# Patient Record
Sex: Male | Born: 1935 | Race: White | Hispanic: No | Marital: Married | Smoking: Never smoker
Health system: Southern US, Community
[De-identification: ages and names within clinical notes are randomized; demographics above are authoritative.]

## PROBLEM LIST (undated history)

## (undated) DIAGNOSIS — I739 Peripheral vascular disease, unspecified: Secondary | ICD-10-CM

## (undated) DIAGNOSIS — I482 Chronic atrial fibrillation, unspecified: Secondary | ICD-10-CM

## (undated) DIAGNOSIS — I499 Cardiac arrhythmia, unspecified: Secondary | ICD-10-CM

## (undated) DIAGNOSIS — N529 Male erectile dysfunction, unspecified: Secondary | ICD-10-CM

## (undated) DIAGNOSIS — I219 Acute myocardial infarction, unspecified: Secondary | ICD-10-CM

## (undated) DIAGNOSIS — E790 Hyperuricemia without signs of inflammatory arthritis and tophaceous disease: Secondary | ICD-10-CM

## (undated) DIAGNOSIS — J449 Chronic obstructive pulmonary disease, unspecified: Secondary | ICD-10-CM

## (undated) DIAGNOSIS — E785 Hyperlipidemia, unspecified: Secondary | ICD-10-CM

## (undated) DIAGNOSIS — R011 Cardiac murmur, unspecified: Secondary | ICD-10-CM

## (undated) DIAGNOSIS — I509 Heart failure, unspecified: Secondary | ICD-10-CM

## (undated) DIAGNOSIS — Z7901 Long term (current) use of anticoagulants: Secondary | ICD-10-CM

## (undated) HISTORY — DX: Acute myocardial infarction, unspecified: I21.9

## (undated) HISTORY — DX: Hyperuricemia without signs of inflammatory arthritis and tophaceous disease: E79.0

## (undated) HISTORY — DX: Heart failure, unspecified: I50.9

## (undated) HISTORY — DX: Long term (current) use of anticoagulants: Z79.01

## (undated) HISTORY — DX: Chronic atrial fibrillation, unspecified: I48.20

## (undated) HISTORY — DX: Male erectile dysfunction, unspecified: N52.9

## (undated) HISTORY — DX: Chronic obstructive pulmonary disease, unspecified: J44.9

## (undated) HISTORY — DX: Hyperlipidemia, unspecified: E78.5

## (undated) HISTORY — DX: Cardiac murmur, unspecified: R01.1

## (undated) HISTORY — DX: Peripheral vascular disease, unspecified: I73.9

## (undated) HISTORY — DX: Cardiac arrhythmia, unspecified: I49.9

---

## 2000-04-07 ENCOUNTER — Encounter: Payer: Self-pay | Admitting: Pulmonary Disease

## 2004-03-27 ENCOUNTER — Ambulatory Visit (HOSPITAL_COMMUNITY): Admission: RE | Admit: 2004-03-27 | Discharge: 2004-03-27 | Payer: Self-pay | Admitting: Gastroenterology

## 2004-03-27 ENCOUNTER — Encounter (INDEPENDENT_AMBULATORY_CARE_PROVIDER_SITE_OTHER): Payer: Self-pay | Admitting: Specialist

## 2006-03-04 ENCOUNTER — Ambulatory Visit: Admission: RE | Admit: 2006-03-04 | Discharge: 2006-03-04 | Payer: Self-pay | Admitting: Podiatry

## 2006-03-04 ENCOUNTER — Encounter: Payer: Self-pay | Admitting: Vascular Surgery

## 2006-03-17 ENCOUNTER — Ambulatory Visit (HOSPITAL_COMMUNITY): Admission: RE | Admit: 2006-03-17 | Discharge: 2006-03-17 | Payer: Self-pay | Admitting: Vascular Surgery

## 2006-05-20 ENCOUNTER — Encounter (INDEPENDENT_AMBULATORY_CARE_PROVIDER_SITE_OTHER): Payer: Self-pay | Admitting: *Deleted

## 2006-05-20 ENCOUNTER — Ambulatory Visit (HOSPITAL_COMMUNITY): Admission: RE | Admit: 2006-05-20 | Discharge: 2006-05-20 | Payer: Self-pay | Admitting: Vascular Surgery

## 2007-11-24 ENCOUNTER — Ambulatory Visit: Payer: Self-pay | Admitting: Vascular Surgery

## 2007-11-30 ENCOUNTER — Ambulatory Visit: Payer: Self-pay | Admitting: Vascular Surgery

## 2007-11-30 ENCOUNTER — Ambulatory Visit (HOSPITAL_COMMUNITY): Admission: RE | Admit: 2007-11-30 | Discharge: 2007-11-30 | Payer: Self-pay | Admitting: Vascular Surgery

## 2007-12-08 ENCOUNTER — Ambulatory Visit: Payer: Self-pay | Admitting: Vascular Surgery

## 2007-12-22 ENCOUNTER — Ambulatory Visit: Payer: Self-pay | Admitting: Vascular Surgery

## 2007-12-30 ENCOUNTER — Ambulatory Visit: Payer: Self-pay | Admitting: Vascular Surgery

## 2007-12-30 ENCOUNTER — Inpatient Hospital Stay (HOSPITAL_COMMUNITY): Admission: RE | Admit: 2007-12-30 | Discharge: 2008-01-02 | Payer: Self-pay | Admitting: Vascular Surgery

## 2007-12-30 ENCOUNTER — Encounter: Payer: Self-pay | Admitting: Vascular Surgery

## 2008-01-12 ENCOUNTER — Ambulatory Visit: Payer: Self-pay | Admitting: Vascular Surgery

## 2008-02-02 ENCOUNTER — Ambulatory Visit: Payer: Self-pay | Admitting: Vascular Surgery

## 2008-02-11 ENCOUNTER — Encounter: Payer: Self-pay | Admitting: Pulmonary Disease

## 2008-02-18 ENCOUNTER — Encounter: Payer: Self-pay | Admitting: Pulmonary Disease

## 2008-02-23 ENCOUNTER — Ambulatory Visit: Payer: Self-pay | Admitting: Vascular Surgery

## 2008-03-01 ENCOUNTER — Encounter: Payer: Self-pay | Admitting: Pulmonary Disease

## 2008-03-08 ENCOUNTER — Ambulatory Visit: Payer: Self-pay | Admitting: Vascular Surgery

## 2008-03-31 ENCOUNTER — Encounter: Payer: Self-pay | Admitting: Pulmonary Disease

## 2008-04-19 ENCOUNTER — Ambulatory Visit: Payer: Self-pay | Admitting: Vascular Surgery

## 2008-05-04 ENCOUNTER — Encounter: Payer: Self-pay | Admitting: Pulmonary Disease

## 2008-06-01 ENCOUNTER — Encounter: Payer: Self-pay | Admitting: Pulmonary Disease

## 2008-06-10 ENCOUNTER — Ambulatory Visit: Payer: Self-pay | Admitting: Pulmonary Disease

## 2008-06-10 DIAGNOSIS — E119 Type 2 diabetes mellitus without complications: Secondary | ICD-10-CM

## 2008-06-10 DIAGNOSIS — R0602 Shortness of breath: Secondary | ICD-10-CM | POA: Insufficient documentation

## 2008-06-10 DIAGNOSIS — I10 Essential (primary) hypertension: Secondary | ICD-10-CM | POA: Insufficient documentation

## 2008-06-10 DIAGNOSIS — I219 Acute myocardial infarction, unspecified: Secondary | ICD-10-CM | POA: Insufficient documentation

## 2008-06-10 DIAGNOSIS — I4891 Unspecified atrial fibrillation: Secondary | ICD-10-CM | POA: Insufficient documentation

## 2008-06-10 DIAGNOSIS — E785 Hyperlipidemia, unspecified: Secondary | ICD-10-CM | POA: Insufficient documentation

## 2008-06-10 DIAGNOSIS — I739 Peripheral vascular disease, unspecified: Secondary | ICD-10-CM

## 2008-06-10 HISTORY — DX: Peripheral vascular disease, unspecified: I73.9

## 2008-06-10 HISTORY — DX: Acute myocardial infarction, unspecified: I21.9

## 2008-07-19 ENCOUNTER — Ambulatory Visit: Payer: Self-pay | Admitting: Vascular Surgery

## 2008-09-11 ENCOUNTER — Inpatient Hospital Stay (HOSPITAL_COMMUNITY): Admission: EM | Admit: 2008-09-11 | Discharge: 2008-09-16 | Payer: Self-pay | Admitting: Emergency Medicine

## 2008-09-11 ENCOUNTER — Ambulatory Visit: Payer: Self-pay | Admitting: Surgery

## 2008-09-14 ENCOUNTER — Encounter: Payer: Self-pay | Admitting: Vascular Surgery

## 2008-09-15 ENCOUNTER — Ambulatory Visit: Payer: Self-pay | Admitting: Physical Medicine & Rehabilitation

## 2008-10-04 ENCOUNTER — Ambulatory Visit: Payer: Self-pay | Admitting: Vascular Surgery

## 2008-11-01 ENCOUNTER — Ambulatory Visit: Payer: Self-pay | Admitting: Vascular Surgery

## 2008-11-22 ENCOUNTER — Ambulatory Visit: Payer: Self-pay | Admitting: Vascular Surgery

## 2008-12-13 ENCOUNTER — Ambulatory Visit: Payer: Self-pay | Admitting: Vascular Surgery

## 2009-03-28 ENCOUNTER — Ambulatory Visit: Payer: Self-pay | Admitting: Vascular Surgery

## 2009-07-13 ENCOUNTER — Ambulatory Visit: Payer: Self-pay | Admitting: Vascular Surgery

## 2009-12-18 IMAGING — CR DG FOOT COMPLETE 3+V*L*
3 series · 3 of 3 positions shown · non-contrast
Comparison: None

CLINICAL DATA: Left foot infection for 3 months.  Diabetes.  Gout.

LEFT FOOT - COMPLETE 3+ VIEW

[view not recorded (1 of 3)]
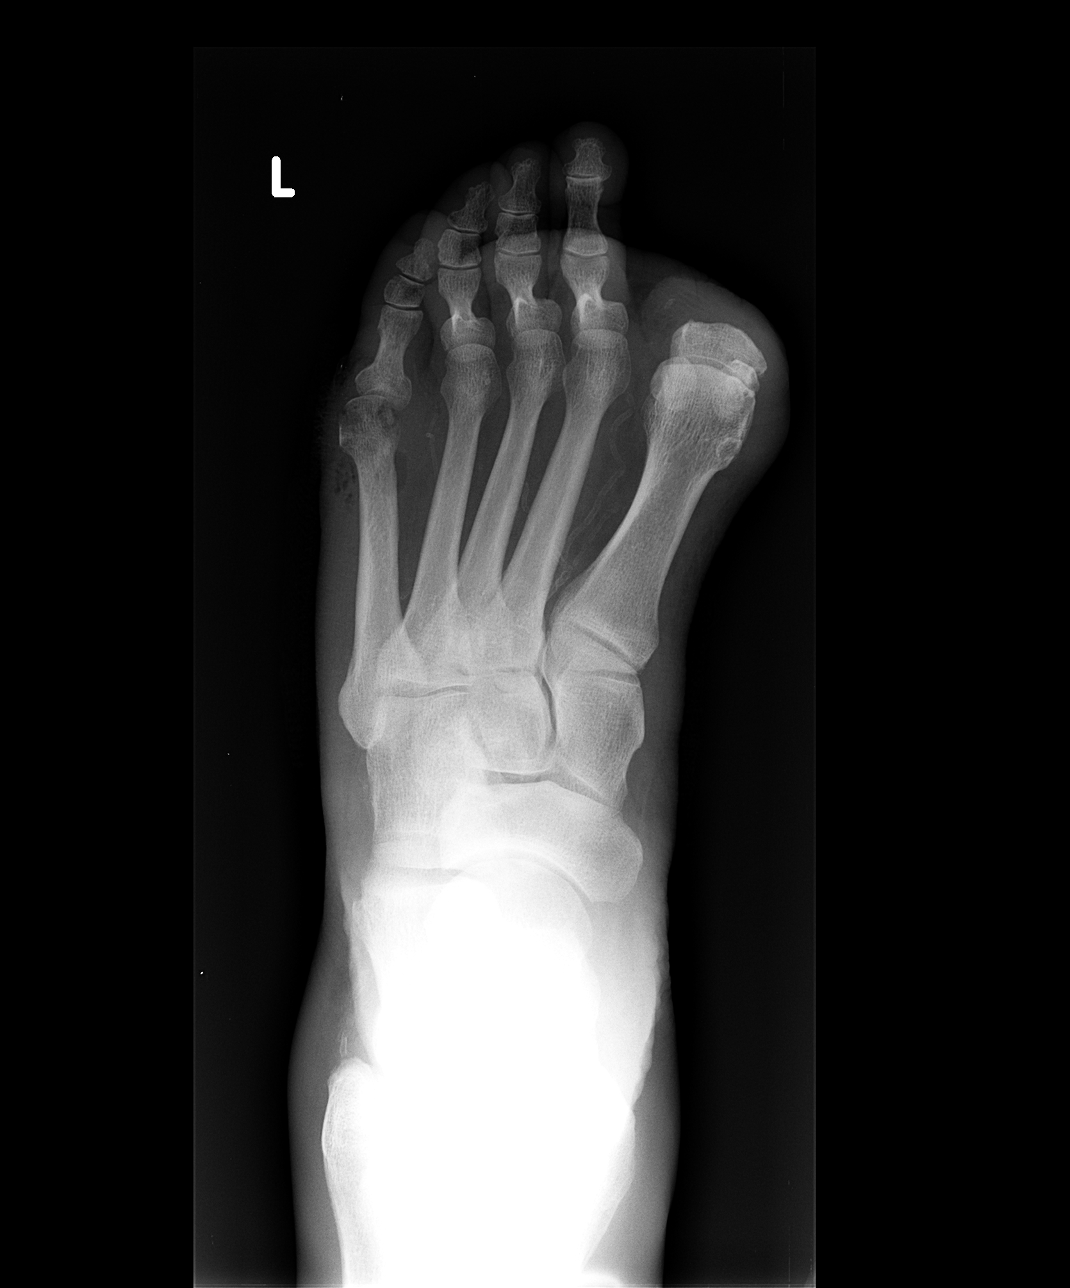

[view not recorded (2 of 3)]
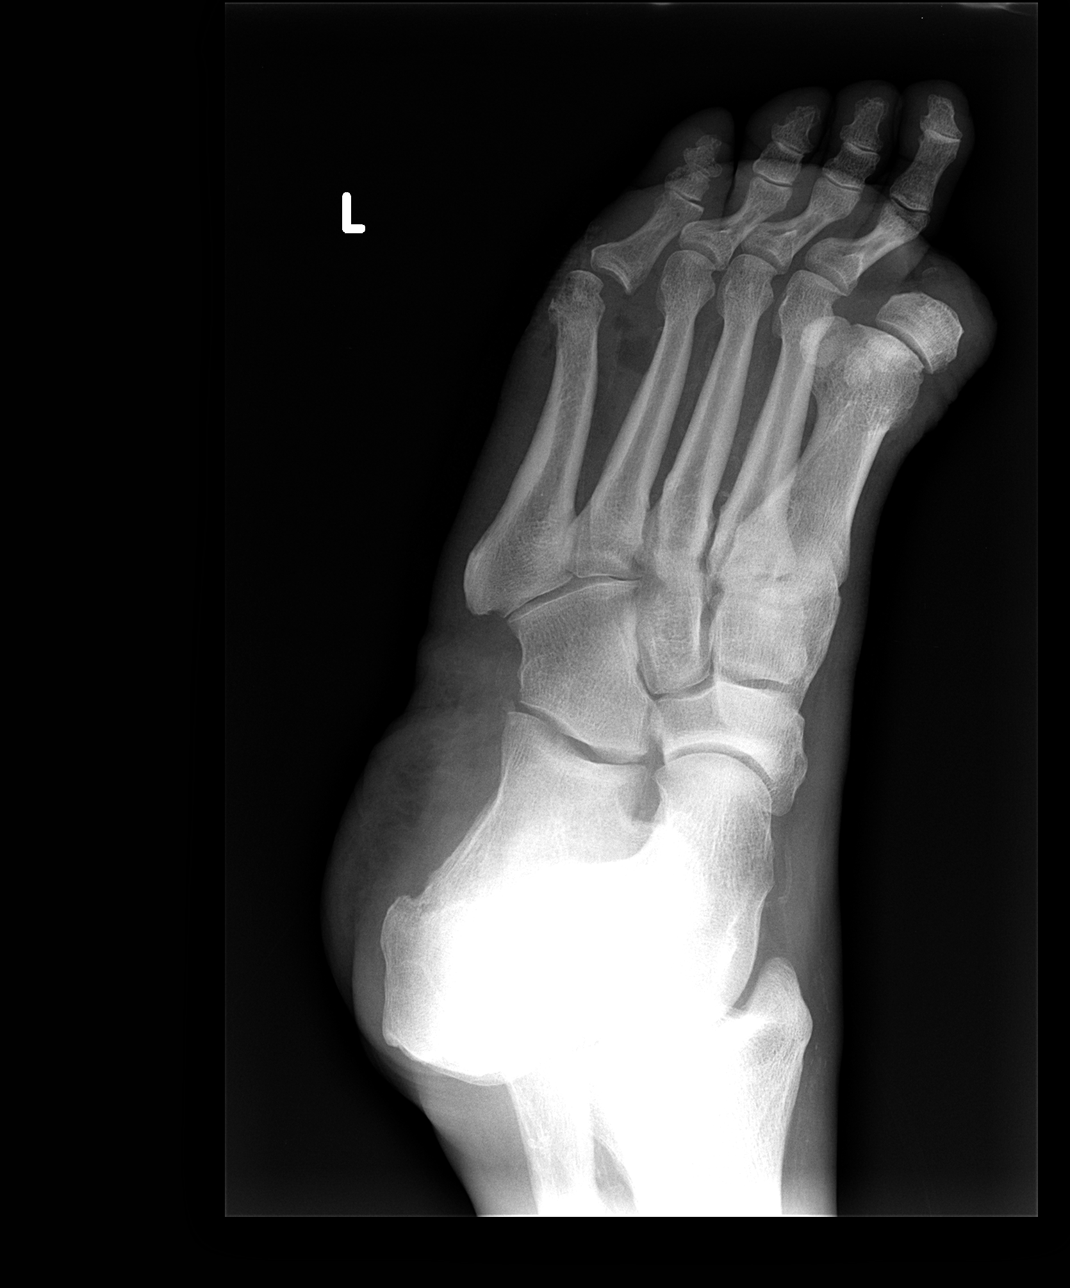

[view not recorded (3 of 3)]
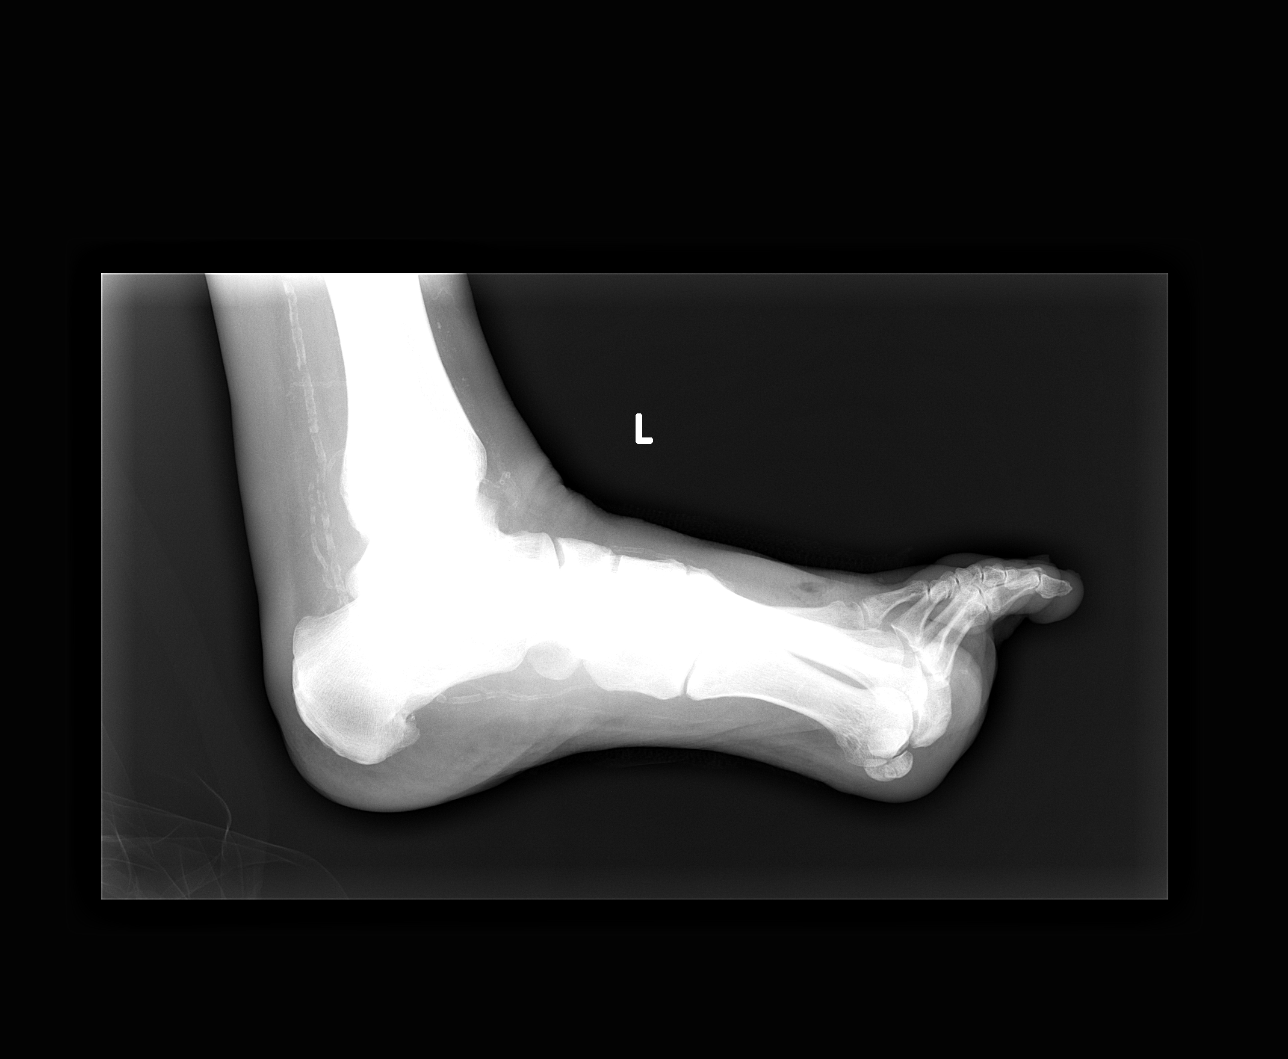

[3 of 3 positions shown; findings below may reference images not displayed]

FINDINGS: Calcification of the distal trifurcational and digital
arteries.  Great toe amputation at the level of the proximal aspect
of the proximal phalanx.  No acute abnormality at that site.  Soft
tissue defects at the level of the neck and head of the fifth
metatarsal and base of the proximal phalanx.  Patchy osteopenic
appearance of the fifth metatarsal head with possible bony
erosions.  Findings are suspicious for osteomyelitis.
IMPRESSION: Suspicion for osteomyelitis involving the head of the fifth
metatarsal

## 2010-05-14 ENCOUNTER — Ambulatory Visit: Payer: Self-pay | Admitting: Cardiology

## 2010-06-13 ENCOUNTER — Ambulatory Visit: Payer: Self-pay | Admitting: Cardiology

## 2010-06-18 ENCOUNTER — Ambulatory Visit: Payer: Self-pay | Admitting: Cardiology

## 2010-06-18 ENCOUNTER — Ambulatory Visit (HOSPITAL_COMMUNITY): Admission: RE | Admit: 2010-06-18 | Discharge: 2010-06-18 | Payer: Self-pay | Admitting: Cardiology

## 2010-07-11 ENCOUNTER — Ambulatory Visit: Payer: Self-pay | Admitting: Cardiology

## 2010-08-13 ENCOUNTER — Ambulatory Visit: Payer: Self-pay | Admitting: Cardiology

## 2010-09-12 ENCOUNTER — Ambulatory Visit: Payer: Self-pay | Admitting: Cardiology

## 2010-10-17 ENCOUNTER — Ambulatory Visit: Payer: Self-pay | Admitting: Cardiology

## 2010-10-24 ENCOUNTER — Ambulatory Visit
Admission: RE | Admit: 2010-10-24 | Discharge: 2010-10-24 | Payer: Self-pay | Source: Home / Self Care | Attending: Vascular Surgery | Admitting: Vascular Surgery

## 2010-11-13 ENCOUNTER — Ambulatory Visit (INDEPENDENT_AMBULATORY_CARE_PROVIDER_SITE_OTHER): Payer: MEDICARE | Admitting: Cardiology

## 2010-11-13 DIAGNOSIS — I4891 Unspecified atrial fibrillation: Secondary | ICD-10-CM

## 2010-11-13 DIAGNOSIS — I119 Hypertensive heart disease without heart failure: Secondary | ICD-10-CM

## 2010-11-13 DIAGNOSIS — E119 Type 2 diabetes mellitus without complications: Secondary | ICD-10-CM

## 2010-11-13 DIAGNOSIS — Z79899 Other long term (current) drug therapy: Secondary | ICD-10-CM

## 2010-12-13 ENCOUNTER — Encounter (INDEPENDENT_AMBULATORY_CARE_PROVIDER_SITE_OTHER): Payer: MEDICARE

## 2010-12-13 DIAGNOSIS — Z7901 Long term (current) use of anticoagulants: Secondary | ICD-10-CM

## 2010-12-13 DIAGNOSIS — I4891 Unspecified atrial fibrillation: Secondary | ICD-10-CM

## 2011-01-14 ENCOUNTER — Ambulatory Visit (INDEPENDENT_AMBULATORY_CARE_PROVIDER_SITE_OTHER): Payer: MEDICARE | Admitting: *Deleted

## 2011-01-14 DIAGNOSIS — Z7901 Long term (current) use of anticoagulants: Secondary | ICD-10-CM

## 2011-01-14 DIAGNOSIS — I4891 Unspecified atrial fibrillation: Secondary | ICD-10-CM

## 2011-01-28 ENCOUNTER — Other Ambulatory Visit: Payer: Self-pay | Admitting: *Deleted

## 2011-01-28 DIAGNOSIS — E119 Type 2 diabetes mellitus without complications: Secondary | ICD-10-CM

## 2011-01-28 DIAGNOSIS — E7800 Pure hypercholesterolemia, unspecified: Secondary | ICD-10-CM

## 2011-02-01 ENCOUNTER — Encounter: Payer: Self-pay | Admitting: *Deleted

## 2011-02-07 ENCOUNTER — Other Ambulatory Visit (INDEPENDENT_AMBULATORY_CARE_PROVIDER_SITE_OTHER): Payer: MEDICARE | Admitting: *Deleted

## 2011-02-07 ENCOUNTER — Ambulatory Visit (INDEPENDENT_AMBULATORY_CARE_PROVIDER_SITE_OTHER): Payer: MEDICARE | Admitting: Cardiology

## 2011-02-07 ENCOUNTER — Ambulatory Visit (INDEPENDENT_AMBULATORY_CARE_PROVIDER_SITE_OTHER): Payer: MEDICARE | Admitting: *Deleted

## 2011-02-07 ENCOUNTER — Encounter: Payer: Self-pay | Admitting: Cardiology

## 2011-02-07 DIAGNOSIS — I4891 Unspecified atrial fibrillation: Secondary | ICD-10-CM

## 2011-02-07 DIAGNOSIS — I10 Essential (primary) hypertension: Secondary | ICD-10-CM

## 2011-02-07 DIAGNOSIS — Z9889 Other specified postprocedural states: Secondary | ICD-10-CM

## 2011-02-07 DIAGNOSIS — E7800 Pure hypercholesterolemia, unspecified: Secondary | ICD-10-CM

## 2011-02-07 DIAGNOSIS — Z7901 Long term (current) use of anticoagulants: Secondary | ICD-10-CM

## 2011-02-07 DIAGNOSIS — E119 Type 2 diabetes mellitus without complications: Secondary | ICD-10-CM

## 2011-02-07 DIAGNOSIS — I739 Peripheral vascular disease, unspecified: Secondary | ICD-10-CM

## 2011-02-07 DIAGNOSIS — Z951 Presence of aortocoronary bypass graft: Secondary | ICD-10-CM | POA: Insufficient documentation

## 2011-02-07 LAB — HEPATIC FUNCTION PANEL
AST: 12 U/L (ref 0–37)
Total Protein: 6.9 g/dL (ref 6.0–8.3)

## 2011-02-07 LAB — BASIC METABOLIC PANEL
BUN: 12 mg/dL (ref 6–23)
CO2: 28 mEq/L (ref 19–32)
Creatinine, Ser: 0.8 mg/dL (ref 0.4–1.5)
GFR: 106.82 mL/min (ref >60.00)
Glucose, Bld: 107 mg/dL — ABNORMAL HIGH (ref 70–99)

## 2011-02-07 LAB — LIPID PANEL: VLDL: 20 mg/dL (ref 0.0–40.0)

## 2011-02-07 LAB — HEMOGLOBIN A1C: Hgb A1c MFr Bld: 7.4 % — ABNORMAL HIGH (ref 4.6–6.5)

## 2011-02-07 NOTE — Progress Notes (Signed)
Adrian Shannon Date of Birth:  1936-04-23 [facility] [address]   [address] [place], [place]  [address] [phone]           Fax   [phone]  HPI: This 75 year old gentleman is seen for a scheduled followup office visit.  He has a complex past medical history he has a history of atrial fibrillation.  He's had peripheral arterial occlusive disease.  He is a former smoker.  He is diabetic.  He's not having any hypoglycemic episodes.  He's not had any recurrent foot ulcers.  He has known ischemic heart disease.  He had coronary artery bypass graft surgery in 1996.  His last nuclear stress test was 02/18/08 and showed an old inferior wall scar but no reversible ischemia and his ejection fraction was 51%.  Current Outpatient Prescriptions  Medication Sig Dispense Refill  . allopurinol (ZYLOPRIM) 100 MG tablet Take 1 tablet by mouth Daily.      Marland Kitchen amLODipine (NORVASC) 5 MG tablet Take 1 tablet by mouth Daily.        Marland Kitchen aspirin 81 MG tablet Take 81 mg by mouth daily.        Marland Kitchen BAYER CONTOUR TEST test strip as directed.      . ferrous sulfate 325 (65 FE) MG tablet Take 325 mg by mouth daily with breakfast.        . furosemide (LASIX) 40 MG tablet Take 40 mg by mouth as needed.        Marland Kitchen glimepiride (AMARYL) 4 MG tablet Take 8 mg by mouth daily.       Marland Kitchen JANTOVEN 5 MG tablet as directed.      Marland Kitchen lisinopril (PRINIVIL,ZESTRIL) 20 MG tablet Take 1 tablet by mouth Daily.      . metFORMIN (GLUCOPHAGE) 500 MG tablet Take by mouth as directed. Taking 2 bid      . multivitamin (THERAGRAN) per tablet Take 1 tablet by mouth daily.        . nitroGLYCERIN (NITROSTAT) 0.4 MG SL tablet Place 0.4 mg under the tongue every 5 (five) minutes as needed.        . simvastatin (ZOCOR) 20 MG tablet Take 1 tablet by mouth 1 dose over 46 hours.        No Known Allergies  Patient Active Problem List  Diagnoses  . DM  . HYPERLIPIDEMIA  . HYPERTENSION  . MYOCARDIAL INFARCTION  . Atrial  fibrillation  . PERIPHERAL VASCULAR DISEASE  . DYSPNEA  . Long term current use of anticoagulant    History  Smoking status  . Unknown If Ever Smoked  Smokeless tobacco  . Current User  . Types: Chew    History  Alcohol Use: Not on file    Family History  Problem Relation Age of Onset  . Diabetes Mother   . Coronary artery disease Mother   . Heart attack Brother     Review of Systems: The patient denies any heat or cold intolerance.  No weight gain or weight loss.  The patient denies headaches or blurry vision.  There is no cough or sputum production.  The patient denies dizziness.  There is no hematuria or hematochezia.  The patient denies any muscle aches or arthritis.  The patient denies any rash.  The patient denies frequent falling or instability.  There is no history of depression or anxiety.  All other systems were reviewed and are negative.   Physical Exam: Filed Vitals:  02/07/11 1342  BP: 122/80  Pulse: 84  The general appearance reveals a well-developed elderly Gentleman in no distress.Pupils equal and reactive.   Extraocular Movements are full.  There is no scleral icterus.  The mouth and pharynx are normal.  The neck is supple.  The carotids reveal no bruits.  The jugular venous pressure is normal.  The thyroid is not enlarged.  There is no lymphadenopathy.The chest is clear to percussion and auscultation. There are no rales or rhonchi. Expansion of the chest is symmetrical.  Heart reveals an irregular rhythm and a soft apical systolic murmur.  No gallop or rub.The abdomen is soft and nontender. Bowel sounds are normal. The liver and spleen are not enlarged. There Are no abdominal masses. There are no bruits.  Extremities reveal no phlebitis or edema.  Pedal pulses are diminished.    Assessment / Plan: Continue same medication.  We are checking fasting lipids and chemistries today results pending.  Recheck in 3 months for followup office visit and he'll return for  monthly protimes his INR today is Elevated at 3.3 and we are reducing his warfarin 25 mg 5 days a week and 7.5 mg on Monday and Friday.

## 2011-02-07 NOTE — Assessment & Plan Note (Signed)
The patient has pain in his feet after he walks a short distance consistent with claudication.  He has not had any further ulcerations on his feet.

## 2011-02-07 NOTE — Assessment & Plan Note (Signed)
The patient has a past history of diabetes mellitus.  He has painful peripheral neuropathy he is not having any hypoglycemic episodes

## 2011-02-07 NOTE — Assessment & Plan Note (Signed)
No recurrent angina.  No symptoms of congestive heart failure.

## 2011-02-07 NOTE — Assessment & Plan Note (Signed)
The patient watches his dietary salt.  His not having any dizziness headache or syncope.

## 2011-02-07 NOTE — Assessment & Plan Note (Signed)
The patient is in chronic atrial fibrillation.  He is on long-term Coumadin.  He has not had any TIA or stroke symptoms.  He's having any chest pain or shortness of breath.  Energy level has been good.

## 2011-02-08 ENCOUNTER — Telehealth: Payer: Self-pay | Admitting: *Deleted

## 2011-02-08 NOTE — Telephone Encounter (Signed)
Adv pt of labs

## 2011-02-19 NOTE — Assessment & Plan Note (Signed)
OFFICE VISIT   Adrian Shannon, Adrian Shannon  DOB:  12-18-1935                                       07/19/2008  ZOXWR#:[id number]   I saw the patient in the office today to evaluate his wound on the left  fifth toe.  He was referred by Dr. Wynelle Cleveland.  I had performed previous  right fifth toe amputation and placement of a vac.  When I saw him last  on 04/19/2008, this wound had healed.  He has had a chronic wound on the  lateral aspect of the left fifth toe.  However, recently this has  progressed and he is followed by Dr. Wynelle Cleveland.  He had a vac placed on  this and this has been changed on Mondays, Wednesdays and Fridays.  He  developed some serous drainage from this wound and was sent for vascular  evaluation.  He has also just been started on p.o. antibiotics.  He has  no significant pain associated with the wound.   PHYSICAL EXAMINATION:  He has a palpable femoral and popliteal pulse on  the left.  He has monophasic flow in the left foot.  I did some  excisional debridement of the wound on the lateral aspect of the left  fifth toe and this bleeds quite well.   He will have his vac put back on tomorrow.  We placed a wet-to-dry  dressing change.  I did not palpate any bone exposed in the wound.  I  think he does have reasonable circulation and hopefully will heal this  with continued placement of the vac.  He has had a previous arteriogram  and on the left side he has a widely patent femoral popliteal system  with single vessel runoff to the peroneal artery.  Anterior tibial and  posterior tibial arteries are occluded.  Thus, there are no options for  revascularization on the left although he does have single vessel runoff  through the peroneal artery.  If the wound failed to heal, then I think  the only option would be amputation of the left fifth toe.  Will have  Dr. Wynelle Cleveland continue to follow the wound and send him back our way if  the wound does not continue  to gradually improve.  Otherwise I plan on  seeing him back in 1 year.   Di Kindle. Edilia Bo, M.D.  Electronically Signed   CSD/MEDQ  D:  07/19/2008  T:  07/20/2008  Job:  [id number]   cc:   Tinnie Gens A. Petrinitz, D.P.M.

## 2011-02-19 NOTE — Procedures (Signed)
LOWER EXTREMITY ARTERIAL EVALUATION-SINGLE LEVEL   INDICATION:  Ulcers on both small toes.   HISTORY:  Diabetes:  Yes.  Cardiac:  No.  Hypertension:  No.  Smoking:  No.  Previous Surgery:  Left great toe amputation on 05/20/06 by Dr. Edilia Bo.   RESTING SYSTOLIC PRESSURES: (ABI)                          RIGHT                LEFT  Brachial:               140                  140  Anterior tibial:        Calcified            Calcified  Posterior tibial:  Peroneal:  DOPPLER WAVEFORM ANALYSIS:  Anterior tibial:        Monophasic           Monophasic  Posterior tibial:       Monophasic           Monophasic  Peroneal:   PREVIOUS ABI'S:  Date: 03/04/06  RIGHT:  Calcified  LEFT:  Calcified   IMPRESSION:  1. Right great toe pressure was 40 mmHg.  2. Left second toe pressure was 40 mmHg.  3. Ankle brachial indices not calculated due to medial calcification.   ___________________________________________  Di Kindle. Edilia Bo, M.D.   DP/MEDQ  D:  11/24/2007  T:  11/25/2007  Job:  [phone] [id number]

## 2011-02-19 NOTE — Assessment & Plan Note (Signed)
OFFICE VISIT   CATLIN, AYCOCK  DOB:  [birth year]                                       11/01/2008  [id number]#:[id number]   I saw the patient in the office today for continued followup of his left  foot wound.  He has undergone open ray amputation of the left fourth and  fifth toes and has had a VAC on this wound now since 09/14/2008.  He has  single vessel runoff via the peroneal artery with no options for  revascularization.  He comes in to have his wound checked.  He has had  no fever or chills and no significant drainage that he is aware of.   The VAC is being changed Mondays, Wednesdays and Fridays.   PHYSICAL EXAMINATION:  On physical examination the foot is warm and well-  perfused.  The VAC was removed and the wound measures 8.5 cm in length  by 1.5 cm in width and this is gradually contracting.  He has reasonable  granulation tissue.  We will see him back in 3 weeks.  Hopefully we will  be able to get this to heal with continued use of the VAC.   Di Kindle. Edilia Bo, M.D.  Electronically Signed   CSD/MEDQ  D:  11/01/2008  T:  11/02/2008  Job:  [id number]   cc:   Tinnie Gens A. Petrinitz, D.P.M.

## 2011-02-19 NOTE — Assessment & Plan Note (Signed)
OFFICE VISIT   AHAD, COLARUSSO  DOB:  12/19/1935                                       11/24/2007  [id number]#:[id number]   I saw the patient in the office today in consultation concerning non-  healing wounds of both feet.  He was referred by Dr. Wynelle Cleveland.  This is  a pleasant 75 year old gentleman whom I had previously performed a left  great toe amputation on in August of 2007 for osteomyelitis of the left  great toe.  Of note, prior to undergoing a toe amputation, he had  undergone an arteriogram in June of 2007, which showed that he had  tibial occlusive disease with no real options for revascularization that  would significantly impact his circulation.  Fortunately, despite his  tibial disease he healed his toe amputation.   He states that, recently, he began wearing some new shoes, and  potentially, this caused the wounds on the lateral aspect of both feet.  This occurred several weeks ago.  He has been followed by Dr. Wynelle Cleveland,  who has been treating this aggressively as an outpatient.  Of note, the  patient denies any history of claudication, rest pain.  He does have a  history of a neuropathy in both feet.   PAST MEDICAL HISTORY:  Significant for adult-onset diabetes.  He does  not require insulin.  In addition, he has hypercholesterolemia.  He had  a myocardial infarction approximately 15 years ago.  He denies any  history of hypertension, history of congestive heart failure, or history  of COPD.   PAST SURGICAL HISTORY:  Significant for coronary revascularization  approximately 15 years ago and vein was taken from the right leg for  this.   FAMILY HISTORY:  There is no history of premature cardiovascular  disease.   SOCIAL HISTORY:  He is married.  He has 2 children.  He works part time.  He does not use tobacco.   REVIEW OF SYSTEMS:  Fairly unremarkable and is documented on the medical  history form in his chart, as are his medications.  Of  note, he is on  Coumadin.   PHYSICAL EXAMINATION:  This is a pleasant 75 year old gentleman who  appears his stated age.  Blood pressure is 141/81, heart rate is 67,  saturation 97%.  There is no cervical lymphadenopathy and I do not  detect any carotid bruits.  Lungs are clear bilaterally to auscultation.  On cardiac exam, he has a regular rate and rhythm.  Abdomen is soft and  nontender.  I cannot palpate an aneurysm.  He has palpable femoral and  popliteal pulses bilaterally.  I cannot palpate pedal pulses on either  side.  He has a well-healed left great toe amputation.  He has a full-  thickness wound in the lateral aspect of his right foot, which I did  some mild excisional debridement on in the office today.  I did not see  involvement of the bone at this point.  There is no significant erythema  or drainage.  He has a small wound in the lateral aspect of his left  foot over the metatarsal head.  He has had vein taken from the lower  part of his right leg.  He has monophasic Doppler signals in both feet.  The vessels are incompressible and ABI could not be obtained.  Right  great toe pressure was 40 mmHg and left second toe pressure was 40 mmHg.   Given his diabetes, I think a toe pressure of 40 on both sides is likely  not adequate for healing.  I have explained that, given his diabetes  with these wounds, he has a limb-threatening situation.  I have  recommended that we proceed with arteriography to see if there are any  options for revascularization.  2 years ago he was limited mostly to  tibial occlusive disease, but things may have progressed since then.  His arteriogram is scheduled for November 30, 2007 and we will stop his  Coumadin prior to the procedure.  We will make further recommendations  pending the results of his arteriogram.  In the meantime, he will  continue to follow up with Dr. Wynelle Cleveland, who is doing excellent care of  taking care of his wounds.    Di Kindle. Edilia Bo, M.D.  Electronically Signed   CSD/MEDQ  D:  11/24/2007  T:  11/25/2007  Job:  [id number]   cc:   Tinnie Gens A. Petrinitz, D.P.M.  Cassell Clement, M.D.

## 2011-02-19 NOTE — Assessment & Plan Note (Signed)
OFFICE VISIT   RONITH, BERTI  DOB:  Apr 12, 1936                                       03/08/2008  [id number]#:[id number]   I saw the patient for continued followup of the right foot wound.  He  has undergone previous arteriogram which showed he had a patent fem-pop  system on the right with single vessel runoff via the peroneal artery  and no options for revascularization.  He underwent right fifth toe  amputation and ultimately had a VAC placed.  When I saw on 02/23/2008  the wound measured 4 cm in length x 1 cm in width.  He comes in for a  routine followup visit today.   PHYSICAL EXAMINATION:  On examination the foot appears adequately  perfused.  It now measures 3 cm in length x 0.5 cm in width and at this  point I think it is safe to discontinue the VAC and go to a daily  dressing change with hydrogel.  We will have the home health nurse  assist with the dressing changes and also teach his wife how to do the  dressings.  I plan on seeing him back in 3 weeks.  I think we will  ultimately be able to get this wound to heal despite his tibial  occlusive disease.   Di Kindle. Edilia Bo, M.D.  Electronically Signed   CSD/MEDQ  D:  03/08/2008  T:  03/09/2008  Job:  [id number]   cc:   Tinnie Gens A. Petrinitz, D.P.M.

## 2011-02-19 NOTE — Discharge Summary (Signed)
NAME:  Adrian Shannon, LEMMERMAN NO.:  [id number]   MEDICAL RECORD NO.:  [id number]          PATIENT TYPE:  INP   LOCATION:  5010                         FACILITY:  [facility]   PHYSICIAN:  Di Kindle. Edilia Bo, M.D.DATE OF BIRTH:  [birth year]   DATE OF ADMISSION:  09/11/2008  DATE OF DISCHARGE:  09/16/2008                               DISCHARGE SUMMARY   REASON FOR ADMISSION:  Diabetic foot infection of the left foot  involving the left fifth and fourth toes.   FINAL DIAGNOSIS:  Diabetic foot infection involving the left fifth and  fourth toes.   SECONDARY DIAGNOSES:  1. Peripheral vascular disease.  2. Atrial fibrillation.  3. Type 2 diabetes.  4. Gout.  5. Hyperlipidemia.  6. Coronary artery disease.  7. Status post coronary artery bypass graft.   HISTORY:  This is a pleasant 75 year old gentleman who had a wound on  the lateral aspect of the left foot.  We had been following this as an  outpatient.  He developed progressive infection involving the lateral  aspect of the left fifth metatarsal and then presented to the emergency  department.  He was evaluated by Dr. Myra Gianotti and was admitted with a  diabetic foot infection, was started on intravenous antibiotics.  Based  on his previous arteriogram, he was not a candidate for  revascularization.  He had a patent iliofemoral system on the left and a  patent fem-pop system with an occluded anterior tibial and peroneal  artery but in-line flow through the tibioperoneal trunk and peroneal  artery on the left.  There were no options thus for revascularization.  He was started on intravenous vancomycin and Zosyn, and this was  continued throughout his hospital course.  The cellulitis really showed  minimal improvement on antibiotics, and therefore, he was taken to the  operating room for open amputation of the fifth toe and possibly the  fourth toe.  He understood there was significant risk of this not  healing and that if  this did not heal, he would require a more proximal  amputation.  On September 14, 2008, he went open ray amputation of the  left fourth and fifth toes and placement of a VAC sponge.  VAC was at  125 cm of suction.  On postoperative day #2, the VAC was changed.  The  wound measured 10 cm in length by 3 cm in width and had excellent  granulation.  The cellulitis was improving.  It was felt that at this  point, we could switch him to p.o. antibiotics and discharge him if his  VAC would be approved for changing this at home on Monday, Wednesdays,  and Fridays.  Plan was for discharge today if this could be arranged  with instructions to follow up, have his pro time checked on Monday.  He  will be switched to p.o. Keflex at discharge.  I will see him back in  the office on October 04, 2008, and the office will call with this  appointment.   Diagnosis condition is good.  Discharge is to home.  Dressing changes  are  VAC change on  Monday, Wednesday, and Friday at 125 cm of suction.   DISCHARGE MEDICATIONS:  1. Coumadin 5 mg p.o. daily except 7.5 mg p.o. on Sundays.  2. Allopurinol 100 mg p.o. daily.  3. Glimepiride 8 mg p.o. daily.  4. Keflex 500 mg p.o. t.i.d.  5. Lisinopril 20 mg p.o. b.i.d.  6. Amlodipine besylate 5 mg p.o. daily.  7. Zocor 40 mg p.o. q.h. q.p.m.  8. Glucophage XR 500 mg 2 tablets b.i.d.  9. Aspirin 81 mg p.o. daily.  10.Ferrous sulfate 325 mg p.o. daily.      Di Kindle. Edilia Bo, M.D.  Electronically Signed     CSD/MEDQ  D:  09/16/2008  T:  09/16/2008  Job:  [id number]   cc:   Joycelyn Rua, M.D.  Cassell Clement, M.D.  Jeffrey A. Petrinitz, D.P.M.

## 2011-02-19 NOTE — Assessment & Plan Note (Signed)
OFFICE VISIT   EMERSON, SCHREIFELS  DOB:  1935-10-20                                       02/02/2008  [id number]#:[id number]   I saw the patient in the office today for continued followup of his left  foot wound.  He has severe tibial occlusive disease in the right with no  options for revascularization.  He underwent a right fifth toe  amputation on 12/30/2007.  We have been treating this with a vac and he  comes in for a 3-week followup visit.  His only complaint has been not  being able to sleep at night and I have written him a prescription for  Ambien 5 mg p.o. nightly p.r.n.   PHYSICAL EXAMINATION:  Blood pressure is 154/82.  The foot appears  adequately perfused.  The wound on the foot measures 5 cm in length by  1.8 cm in width.  The width is decreased approximately 1/2 of a cm over  3 weeks.  There is granulation tissue, although is not especially bright  red.   I have recommended we continue the vac to help stimulate contraction and  also stimulate granulation tissue given this marginally perfused wound  with no options for revascularization.  I plan on seeing him back in 3  weeks.  He knows to call sooner if he has problems.   Di Kindle. Edilia Bo, M.D.  Electronically Signed   CSD/MEDQ  D:  02/02/2008  T:  02/03/2008  Job:  [id number]   cc:   Tinnie Gens A. Petrinitz, D.P.M.

## 2011-02-19 NOTE — H&P (Signed)
HISTORY AND PHYSICAL EXAMINATION   December 22, 2007   Re:  GABRIELE, LOVELAND               DOB:  1936-05-24   REASON FOR ADMISSION:  Nonhealing right fifth toe wound.   HISTORY:  This is a patient who I saw in consultation on 11/24/07 with  nonhealing wounds of both feet.  He previously had a left great toe  amputation in 08/07 for osteomyelitis of the left great toe.  Of note,  prior to undergoing this toe amputation, he had undergone an arteriogram  in 06/07, which showed no good options for revascularization.  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  Fortunately, the toe amputation  healed despite this.  Recently, he had been wearing some new shoes, and  potentially this caused a wound on the lateral aspect of both feet.  This occurred in late January.  His wounds have been treated  aggressively as an outpatient by Dr. Wynelle Cleveland, and the wound on the  left has gradually improved, although the wound on the right has shown  no signs of improvement and likely goes quite close to the bone.  Of  note, the patient denies any history of claudication, although his  activity is fairly limited.  He denies any history of rest pain.  He  does have a history of neuropathy in both feet.   His past medical history is significant for adult-onset diabetes.  He  does not require insulin.  In addition, he has hypercholesterolemia.  He  had a myocardial infarction approximately 15 years ago.  He denies any  history of hypertension, history of congestive heart failure, or history  of COPD.   PAST SURGICAL HISTORY:  Significant for coronary revascularization  approximately 15 years ago, and vein was taken from the right leg.   FAMILY HISTORY:  There is no history of premature cardiovascular  disease.   SOCIAL HISTORY:  He is married.  He has two children.  He does not use  tobacco.   REVIEW OF SYSTEMS:  GENERAL:  He has had no recent weight loss, weight  gain, or  problems with his appetite.  He is 174 pounds, 6 feet tall.  CARDIAC:  He has had no chest pain, chest pressure, palpitations, or  arrhythmias.  PULMONARY:  He has had no bronchitis, asthma, or wheezing.  GI:  He has had no recent change in his bowel habits and has no history  of peptic ulcer disease.  GU:  He has had no dysuria or frequency.  VASCULAR:  He has had no claudication or rest pain.  He has had no  history of DVT or phlebitis.  NEURO:  He has had no dizziness, blackouts, headaches, or seizures.  ORTHO/SKIN:  He has had no joint pain, muscle pain, or rash.  PSYCHIATRIC:  He has had no depression or nervousness.  HEMATOLOGIC:  He is on Coumadin.  He has had no bleeding problems or  clotting disorders.  He says he is on Coumadin for his heart.   MEDICATIONS:  1. Glucophage XR 500 mg p.o. daily.  2. Zocor 80 mg p.o. daily.  3. Amaryl 4 mg p.o. daily.  4. Allopurinol 100 mg p.o. daily.  5. Metoprolol 50 mg p.o. daily.  6. Lanoxin 0.125 mg p.o. daily.  7. Coumadin.  8. Aspirin 81 mg p.o. daily.   His Coumadin was held on 12/22/07.   PHYSICAL EXAMINATION:  This is a pleasant 75 year old gentleman  who  appears his stated age.  Blood pressure is 116/71, heart rate is 61.  I  do not detect any carotid bruits.  Lungs are clear bilaterally to  auscultation.  On cardiac exam, he has a regular rate and rhythm.  Abdomen is soft and nontender.  I cannot palpate an aneurysm.  He has  normal-pitched bowel sounds.  He has palpable femoral and popliteal  pulses bilaterally, and I cannot palpate pedal pulses on either side.  He has a well-healed left great toe amputation site.  He has a full-  thickness wound in the lateral aspect of his right foot.  There is a  small wound over the lateral aspect of the left foot which is gradually  healing.   He did have a right great toe pressure of 40 mmHg, and a left great toe  pressure of 40 mmHg on a previous Doppler study.   His arteriogram  shows severe tibial occlusive disease bilaterally.  On  the right side, the femoral popliteal segment is patent.  The anterior  tibial and posterior tibial arteries are occluded.  There is single  vessel run-off via the peroneal artery on the right.   I have explained that we really do not have any good options for  revascularization.  Circulation is certainly marginal, and the risk of  proceeding with toe amputation is that he may have inadequate  circulation to heal this, and we can simply create another wound further  back on the foot.  I have also explained that it is possible that the  adjacent four metatarsal on the right foot to be involved, and he could  potentially required amputation of the fourth and fifth toes.  However,  I would agree with Dr. Wynelle Cleveland that this is likely the best chance for  limb salvage, that is, amputation of the fifth toe and possibly the  fourth toe and hopefully primary closure.  If this is not possible, then  we would attempt the VAC.  We will keep him in the hospital on IV  antibiotics for a few days and also teach him to be nonweightbearing on  the right foot to give him the maximum chance of healing this wound.  If  this fails to heal, unfortunately, I think he would require below-knee  amputation.  I have discussed the procedure and potential complications  in detail with the patient today, and all of his questions are answered,  and he is agreeable to proceed.   Di Kindle. Edilia Bo, M.D.  Electronically Signed   CSD/MEDQ  D:  12/22/2007  T:  12/23/2007  Job:  [id number]   cc:   Tinnie Gens A. Petrinitz, D.P.M.

## 2011-02-19 NOTE — Assessment & Plan Note (Signed)
OFFICE VISIT   KELDRICK, POMPLUN  DOB:  1936-02-04                                       11/22/2008  ZOXWR#:[id number]   The patient had open ray amputation of the left fourth and fifth toes  with placement of a VAC on 09/14/2008.  He comes in for a routine wound  check.  The wound has improved significantly and now measures 4 cm in  length x 0.5 cm in width.  At this point I think it is safe to change to  a daily dressing change with hydrogel and a moist 2x2 and Kerlix.  I  will see him back in 3 weeks.  He knows to call sooner if he has  problems.   Di Kindle. Edilia Bo, M.D.  Electronically Signed   CSD/MEDQ  D:  11/22/2008  T:  11/23/2008  Job:  [id number]

## 2011-02-19 NOTE — Assessment & Plan Note (Signed)
OFFICE VISIT   Adrian Shannon, Adrian Shannon  DOB:  [birth year]                                       01/12/2008  OACZY#:[id number]   I saw patient in the office today for continued followup of his open toe  amputation of the right fifth toe.  He has had the vac in place, and he  comes in for a routine wound check.   PHYSICAL EXAMINATION:  The wound appears to be granulating.  There is no  significant devitalized tissue.  I did some very minimal excisional  debridement, and there is good bleeding.  The wound measures 5 cm in  length x 2.3 cm in width.   We reapplied his vac, and I will see him back in three weeks.  His  amputation was on 12/30/07.   Di Kindle. Edilia Bo, M.D.  Electronically Signed   CSD/MEDQ  D:  01/12/2008  T:  01/13/2008  Job:  [id number]

## 2011-02-19 NOTE — Assessment & Plan Note (Signed)
OFFICE VISIT   Adrian Shannon, Adrian Shannon  DOB:  January 31, 1936                                       12/13/2008  EAVWU#:[id number]    I saw the patient in the office today for continued followup of his left  foot.  He had open ray amputation of the left fourth and fifth toes with  placement of a VAC in December of 2009.  He comes in for a routine wound  check.  The wound is now almost completely healed.  There is just one  small area at the most distal aspect of the wound which is about 0.5 cm  in width.  There is no drainage or erythema.  I have instructed him to  continue his dressing changes until this is completely healed and then  he can simply keep the skin well lubricated to prevent future ulcers.  I  plan on seeing him back in 3 months.  He knows to call sooner if he has  problems.   Di Kindle. Edilia Bo, M.D.  Electronically Signed   CSD/MEDQ  D:  12/13/2008  T:  12/14/2008  Job:  [id number]

## 2011-02-19 NOTE — Assessment & Plan Note (Signed)
OFFICE VISIT   Adrian Shannon, Adrian Shannon  DOB:  06/14/1936                                       12/08/2007  ZOXWR#:[id number]   I saw the patient in the office today for continued followup of his  bilateral foot wounds.  I saw him in consultation on 11/24/2007 with a  superficial wound on the lateral aspect of his left foot, more involved  than the wound on the lateral aspect of his right foot.  He underwent an  arteriogram on 11/30/2007.  This showed no evidence of aortoiliac  occlusive disease.  In addition, the femoral and popliteal vessels  bilaterally were widely patent.  Unfortunately, he had severe tibial  occlusive disease with an occluded anterior tibial and posterior tibial  artery bilaterally.  He had single vessel runoff via the peroneal artery  bilaterally.  In short, there was no option for revascularization which  would impact on the circulation of his distal foot.  He comes in today  to have his wounds checked.   PHYSICAL EXAMINATION:  Blood pressure 109/64, heart rate is 64.  The  wound on the left foot is fairly superficial.  There is no erythema or  drainage.  This appears to be gradually healing.  On the right side, he  has some soft tissue overlying the lateral aspect of the metatarsal  head.  There is no significant erythema or drainage currently.  However,  I suspect that this gets fairly close to the bone.   I have explained that, really, the only options are continued outpatient  aggressive wound care with the hopes that this will gradually heal,  although this may take a long time.  The only other option would be to  do a ray amputation of the right fifth toe.  However, given his  circulation, there is a good chance that this will not heal, and he will  still be left with another non-healing wound further back on the foot.  His toe pressure on the right is 40 mmHg, which is borderline for  healing, given his history of diabetes.  If either  the wound or toe  amputation on the right would fail to heal, he would require below the  knee amputation.  Again, I have reinforced to him that there are no  options for revascularization which would significantly impact the  circulation of his foot.   He will continue to follow closely with Dr. Wynelle Cleveland, and I will plan  on seeing him back in 6 weeks.  We will see him sooner if Dr. Wynelle Cleveland  refers him back sooner, or if the patient calls.   Di Kindle. Edilia Bo, M.D.  Electronically Signed   CSD/MEDQ  D:  12/08/2007  T:  12/09/2007  Job:  [id number]   cc:   Tinnie Gens A. Petrinitz, D.P.M.

## 2011-02-19 NOTE — Op Note (Signed)
NAME:  Adrian Shannon, Adrian Shannon NO.:  [id number]   MEDICAL RECORD NO.:  [id number]          PATIENT TYPE:  OIB   LOCATION:  5125                         FACILITY:  [facility]   PHYSICIAN:  Di Kindle. Edilia Bo, M.D.DATE OF BIRTH:  13-Nov-1935   DATE OF PROCEDURE:  12/30/2007  DATE OF DISCHARGE:                               OPERATIVE REPORT   INDICATIONS:  This is a 75 year old gentleman who presented with a  nonhealing wound on the lateral aspect of his right fifth toe.  He had  undergone an arteriogram which showed a patent fem-pop system with  single vessel runoff via the peroneal artery.  There were no options for  revascularization which would impact the circulation of his right foot.  The wound showed no evidence of healing and it was felt really the only  option for limb salvage was toe amputation and possible placement of a  VAC.  The patient and wife understood that if this did not heal he would  likely require below-the-knee amputation.   PROCEDURE IN DETAIL:  The patient was taken to the operating room and  received a general anesthesia.  An ankle block was not used as the  patient had edema and some mild cellulitis of the right foot.  The right  foot and lower leg were prepped and draped in the usual sterile fashion.  An elliptical incision was made encompassing the large eschar over the  lateral aspect of the toe and the fifth toe in its entirety was excised.  The dissection was carried down to the metatarsal bone which was then  divided and the bone rongeured back to healthy bone.  All devitalized  tissue was removed.  There was no exposed bone.  The dissection was  close to the tendon sheath of the fourth metatarsal but there was a  small amount of soft tissue overlying this.  Bleeding was very  reasonable initially.  Hemostasis was obtained using electrocautery.  The wound was irrigated with copious amounts of saline.  I did obtain a  culture of the dry wound  although there was really no gross purulence.  The distal aspect of the incision was closed loosely with two 4-0  nylons.  The proximal aspect was closed with one deep layer of 3-0  Vicryl and two 4-0 nylons.  The remainder of the wound could not be  closed without undue tension and a wet dressing was applied while the  VAC was being obtained and this will be placed in the recovery room.  A  sterile dressing was applied.  The patient tolerated the procedure well  and was transferred to the recovery room in satisfactory condition.  All  needle and sponge counts were correct.      Di Kindle. Edilia Bo, M.D.  Electronically Signed     CSD/MEDQ  D:  12/30/2007  T:  12/30/2007  Job:  [id number]   cc:   Tinnie Gens A. Petrinitz, D.P.M.

## 2011-02-19 NOTE — Assessment & Plan Note (Signed)
OFFICE VISIT   Adrian Shannon, Adrian Shannon  DOB:  [birth year]                                       07/13/2009  ZOXWR#:[id number]   I saw the patient in the office today for continued followup of his  peripheral vascular disease.  He has had a previous right fifth toe  amputation and left great toe and left fourth and fifth toe amputations.  His most recent wound was on the lateral aspect of his left foot and  these wounds have healed.  Of note, he has significant tibial occlusive  disease on the left with single vessel runoff via the peroneal artery  based on his arteriogram from February of 2009.  He also has significant  tibial occlusive disease on the right with single vessel runoff via the  peroneal.  He comes in for a routine check.  He has had no significant  claudication or rest pain.   REVIEW OF SYSTEMS:  He has had no chest pain or significant shortness of  breath.   PHYSICAL EXAMINATION:  This is a pleasant 75 year old gentleman who  appears his stated age.  Blood pressure is 146/83, heart rate is 62.  I  do not detect any carotid bruits.  Lungs are clear bilaterally to  auscultation.  On cardiac exam he has a regular rate and rhythm.  He has  palpable femoral pulses.  I cannot palpate pedal pulses however both  feet appear adequately perfused and all of his wounds are healed.   Overall I am pleased with his progress.  I will see him back in 1 year.  He knows to call sooner if he has problems.   Di Kindle. Edilia Bo, M.D.  Electronically Signed   CSD/MEDQ  D:  07/13/2009  T:  07/14/2009  Job:  [id number]

## 2011-02-19 NOTE — Op Note (Signed)
NAME:  [name], [name]               ACCOUNT NO.:  [id number]   MEDICAL RECORD NO.:  [id number]          PATIENT TYPE:  AMB   LOCATION:  SDS                          FACILITY:  [facility]   PHYSICIAN:  Di Kindle. Edilia Bo, M.D.DATE OF BIRTH:  01/10/1936   DATE OF PROCEDURE:  11/30/2007  DATE OF DISCHARGE:                               OPERATIVE REPORT   PREOPERATIVE DIAGNOSIS:  Nonhealing wounds of bilateral feet.   POSTOPERATIVE DIAGNOSIS:  Nonhealing wounds of bilateral feet, with  severe tibial occlusive disease.   PROCEDURE:  1. Ultrasound guided right femoral artery access.  2. Aortogram.  3. Bilateral iliac arteriogram.  4. Bilateral lower extremity runoff.  5. Selective left common iliac artery arteriogram.   INDICATIONS:  This is a 75 year old gentleman who presented with  nonhealing wounds of both feet most extensively on the right foot where  he has a full-thickness wound on the lateral aspect of his foot.  He is  brought in for diagnostic arteriography to see if he has any options for  revascularization.   TECHNIQUE:  The patient was taken to the PV lab at [facility] and sedated with  0.5 mg of Versed and 25 mcg of fentanyl.  Under ultrasound guidance  after the skin was anesthetized, the right common femoral artery was  cannulated and a guidewire introduced into the infrarenal aorta under  fluoroscopic control.  A 5-French sheath was introduced over the wire.  Next the pigtail catheter was positioned at the L1 vertebral body and  flush aortogram obtained.  The catheter was then repositioned above the  aortic bifurcation and oblique iliac projections were obtained.  Next an  Omni flush catheter was exchanged for the pigtail catheter.  This was  positioned into the left common iliac artery and selective left common  iliac arteriogram obtained with runoff to the left lower extremity and  this was later exchanged for an IMA catheter which was positioned on the  left common iliac  artery for additional spot films of the left leg.  Next the IMA catheter was removed over wire and the selective right  femoral arteriogram obtained.   FINDINGS:  There is a single renal artery on the right.  On the left  side there is an accessory inferior left renal artery.  There is no  significant renal artery stenosis noted.  The infrarenal aorta is widely  patent.  Both common iliac, external iliac and hypogastric arteries are  widely patent.   On the left side, the common femoral, superficial femoral, deep femoral  and popliteal arteries are all widely patent.  The tibioperoneal trunk  and peroneal artery are patent on the left.  The anterior tibial and  posterior tibial arteries are occluded.   On the right side, the common femoral, superficial femoral and deep  femoral arteries are patent.  The popliteal artery is patent.  The  anterior tibial and posterior tibial arteries are occluded.  The  peroneal artery is patent.  There is a focal  stenosis in the mid segment of peroneal artery.  There is extensive  collateralization.  There is reconstitution  of a small dorsalis pedis  artery in the foot.   CONCLUSIONS:  Severe tibial occlusive disease as described above.      Di Kindle. Edilia Bo, M.D.  Electronically Signed     CSD/MEDQ  D:  11/30/2007  T:  11/30/2007  Job:  [id number]   cc:   Cassell Clement, M.D.  Jeffrey A. Petrinitz, D.P.M.

## 2011-02-19 NOTE — Assessment & Plan Note (Signed)
OFFICE VISIT   Adrian Shannon, Adrian Shannon  DOB:  [birth year]                                       02/23/2008  EAVWU#:[id number]   I saw the patient in the office today for continued followup of his  wound on the right foot.  He has undergone an arteriogram which showed a  patent fem-pop system on the right with a single vessel runoff via the  peroneal artery.  There were no options for revascularization.  He  underwent right fifth toe amputation and ultimately had placement of a  vac.  We have been following this at 3-week intervals and the wound has  been gradually improving.   The patient states that he has had no significant pain in the foot.  He  has been ambulating some.  He has a small wound on the lateral aspect of  his left foot which he has been dressing.   PHYSICAL EXAMINATION:  Blood pressure is 150/73, heart rate is 45.  The  right foot appears adequately perfused.  The wound measures 4 cm in  length x 1 cm in width and this has continued to gradually decrease.  The tissue appears marginally perfused.   On the left side, he has a small open wound with mild surrounding  erythema but no drainage.  It does not appear to be especially deep at  this point.  Of note, his arteriogram on 11/30/2007 showed a similar  situation on the left with patent arteries down to the popliteal with  single-vessel runoff via the peroneal artery.  The anterior tibial and  posterior tibial arteries were occluded.   There are no real options for revascularization on either side.  We will  continue with the vac as long as we are making progress with this.  I  plan on seeing him back in 3 weeks.  We will also have to keep a close  eye on the wound on the left foot.   Di Kindle. Edilia Bo, M.D.  Electronically Signed   CSD/MEDQ  D:  02/23/2008  T:  02/24/2008  Job:  [id number]   cc:   Tinnie Gens A. Petrinitz, D.P.M.

## 2011-02-19 NOTE — Assessment & Plan Note (Signed)
OFFICE VISIT   AMR, STURTEVANT  DOB:  [birth year]                                       04/19/2008  LKGMW#:[id number]   I saw the patient in the office today for followup of his right foot  wound.  He had undergone a previous arteriogram which showed patent fem-  pop system and single vessel runoff via the peroneal artery with no good  options for revascularization.  He underwent a right fifth toe  amputation and placement of a vac and he comes in for a routine followup  visit.  When I last saw him on 03/08/2008 it was 4 cm in length by 1 cm  in width.  Today he comes in with the wound essentially healed.   He has had no fever or chills.   PHYSICAL EXAMINATION:  Blood pressure is 175/76, heart rate is 45.  The  foot appears warm and adequately perfused.  The toe amputation site has  healed completely with no open areas.   I have instructed him to keep the skin well lubricated to prevent  cracks.  Dr. Wynelle Cleveland has fitted him for a special shoe, which I think  will help.  I plan on seeing him back in 6 months.  He knows to call  sooner if he has problems.   Di Kindle. Edilia Bo, M.D.  Electronically Signed   CSD/MEDQ  D:  04/19/2008  T:  04/20/2008  Job:  [phone]

## 2011-02-19 NOTE — Assessment & Plan Note (Signed)
OFFICE VISIT   ARVELL, PULSIFER  DOB:  [birth year]                                       10/04/2008  ZOXWR#:[id number]   I saw the patient in the office today for continued followup of his left  foot wound.  He has undergone open ray amputation of the left fourth and  fifth toes and placement of a vac.  This was done 09/14/2008.  He had a  single vessel runoff on the left via the peroneal artery with no other  options for revascularization.  He comes in for routine wound check.  The vac has been changed Mondays, Wednesdays and Fridays.   EXAMINATION:  The vac was removed.  There is excellent granulation  tissue.  I did a small amount of excisional debridement at the most  distal part of the wound.  The wound measures 9.5 cm in length and 3 cm  in width.   There is a slight odor to the wound, and so we are going to try the vac  with silver, and I will see him back in 3 weeks.  As long as we are  making progress with the vac will continue with the vac changes Monday,  Wednesdays and Fridays.  I think this has a reasonable chance of healing  despite his tibial artery occlusive disease.   Di Kindle. Edilia Bo, M.D.  Electronically Signed   CSD/MEDQ  D:  10/04/2008  T:  10/05/2008  Job:  [id number]   cc:   Tinnie Gens A. Petrinitz, D.P.M.

## 2011-02-19 NOTE — Discharge Summary (Signed)
NAME:  Adrian Shannon, Adrian Shannon NO.:  [id number]   MEDICAL RECORD NO.:  [id number]          PATIENT TYPE:  OIB   LOCATION:  5125                         FACILITY:  [facility]   PHYSICIAN:  Di Kindle. Edilia Bo, M.D.DATE OF BIRTH:  Mar 19, 1936   DATE OF ADMISSION:  12/30/2007  DATE OF DISCHARGE:  01/02/2008                               DISCHARGE SUMMARY   REASON FOR ADMISSION:  Gangrene of the right fifth toe with tibial  artery occlusive disease.   HISTORY:  This is a pleasant 75 year old gentleman who had presented  with nonhealing wounds of both feet on November 24, 2007.  He had  previous left great toe amputation in August 2007 for osteomyelitis of  the left great toe.  Prior to his toe amputation, he had undergone an  arteriogram in June 2007, which showed no good options for  revascularization.  He had severe tibial occlusive disease on the left.  Despite marginal flow in the left foot, his toe amputation did  ultimately heal.  He subsequently developed a wound on the lateral  aspect of his left foot and then a larger wound on the lateral aspect of  his right foot.  He has been followed by Dr. Wynelle Cleveland, who has been  treating his wounds aggressively as an outpatient.  The wound on the  left is continued to show gradual improvement and appears to be healing.  On the right side, the wound was showing no signs of improvement and  appeared to be gradually enlarging.  An x-ray did not show any evidence  of osteomyelitis.   PAST MEDICAL HISTORY:  Significant for adult-onset diabetes.  He does  not require insulin.  In addition, he has hypercholesteremia.  He had a  myocardial infarction 15 years ago.  His cardiologist is Dr. Patty Sermons.  He has been on Coumadin, he says for his heart.  He did undergo an  arteriogram, which showed that he had no significant inflow disease on  the right.  His femoral-popliteal system was patent on the right.  He  had a severe tibial  occlusive disease with single vessel run up via the  peroneal artery.  There were no options for revascularization, which  would impact the circulation of the foot.  His toe pressure on the right  was 40, and it was felt given that the wound did show no evidence of  improvement and was gradually enlarging, the only real option was  amputation of the toe and either primary closure or placement of a VAC  despite the marginal flow.  The main risk obviously was that of  nonhealing.  He understood that if the wound did not heal, he would  likely require below-the-knee amputation.  He was agreeable to proceed  with the toe amputation.   HOSPITAL COURSE:  The patient was admitted on December 30, 2007.  He  underwent a ray amputation of the right fifth toe.  The wound did not  appear to involve the adjacent of fourth toe.  There was no exposed  bone, although the wound was fairly close to the tendon.  The wound was  partially closed, but the majority of the wound had to be left open as  there otherwise have been undue tension, and therefore, a VAC was  placed.  VAC was connected to 100 cm of suction and was maintained  throughout his hospital course to being changed on Mondays, Wednesdays,  and Fridays.  He was placed on Zosyn postoperatively.  By postoperative  day #2, the wound was fairly clean with no significant drainage.  It  appeared to be reasonably well perfused.  The patient has had no fever  or significant erythema.  It was felt that the patient will be ready for  discharged to home by postoperative day #3 with plans with the home  health nurse to change the [facility] on Mondays, Wednesdays, and Fridays.  In  addition, he was provided with a Darko shoe during this admission and  physical therapy worked with him to be sure that he could be ambulatory  with partial weightbearing on the right foot with no pressure on the  forefoot and pressure only on the heel.  He did well with this as he had   previously done this on the other side.  He had been restarted on his  Coumadin, and by postoperative day 3, had received his third dose of  Coumadin.  He was discharged to home with instructions to have his  Coumadin, continued to be followed by Dr. Patty Sermons, and I would see him  back in 2 weeks to follow his wounds.  As long as he was continued to  make progress with the VAC, we would continue the VAC.  It was felt that  it was his best chance for limb salvage.  His Zosyn was discontinued on  discharge with plans for him to continue his Keflex.   DISCHARGE MEDICATIONS:  1. Aspirin 81 mg p.o. daily.  2. Metformin 1000 mg p.o. b.i.d.  3. Zocor 40 mg p.o. daily.  4. Glimepiride 4 mg two p.o. daily.  5. Lanoxin 0.125 mg p.o. daily.  6. Allopurinol 100 mg p.o. daily.  7. Coumadin 5 mg p.o. daily.  8. Januvia 100 mg p.o. daily.  9. Keflex 500 mg p.o. t.i.d.  10.Metoprolol 50 mg p.o. daily.   DISCHARGE DIAGNOSIS:  Nonhealing wound of the right foot with gangrene  and tibial artery occlusive disease.   SECONDARY DIAGNOSES:  1. Coronary artery disease.  2. Non-insulin-dependent diabetes.  3. Hypercholesteremia.   CONDITION ON DISCHARGE:  Good.      Di Kindle. Edilia Bo, M.D.  Electronically Signed     CSD/MEDQ  D:  01/01/2008  T:  01/01/2008  Job:  [id number]   cc:   Cassell Clement, M.D.  Jeffrey A. Petrinitz, D.P.M.

## 2011-02-19 NOTE — Assessment & Plan Note (Signed)
OFFICE VISIT   Adrian Shannon, Adrian Shannon  DOB:  1936/03/20                                       03/28/2009  ZOXWR#:[id number]   I saw the patient in the office today for continued followup of his left  foot wound.  He had an open ray amputation of the left fourth and fifth  toes with placement of VAC in December and this wound has now completely  healed.  He has had no significant rest pain or drainage from the wound  and he has been keeping the wound clean and dry.   REVIEW OF SYSTEMS:  He has had no fever or chills.   PHYSICAL EXAMINATION:  Blood pressure is 153/86, heart rate is 90.  Both  feet are warm and well-perfused.  His wounds on both feet are all  healed.   Of note, his previous arteriogram from February of 2009 shows single  vessel runoff via the peroneal artery bilaterally.  I plan on seeing him  back in 1 year unless he calls sooner.   Di Kindle. Edilia Bo, M.D.  Electronically Signed   CSD/MEDQ  D:  03/28/2009  T:  03/29/2009  Job:  [id number]

## 2011-02-19 NOTE — Op Note (Signed)
NAME:  Adrian Shannon, Adrian Shannon NO.:  [id number]   MEDICAL RECORD NO.:  [id number]          PATIENT TYPE:  INP   LOCATION:  5010                         FACILITY:  [facility]   PHYSICIAN:  Di Kindle. Edilia Bo, M.D.DATE OF BIRTH:  [birth year]   DATE OF PROCEDURE:  DATE OF DISCHARGE:                               OPERATIVE REPORT   PREOPERATIVE DIAGNOSIS:  Gangrene of the left fifth toe.   POSTOPERATIVE DIAGNOSIS:  Gangrene of the left fifth and fourth toe.   PROCEDURE:  Open ray amputation of left fourth and fifth toe with  placement of VAC.   SURGEON:  Di Kindle. Edilia Bo, MD   ASSISTANT:  Nurse.   ANESTHESIA:  General.   INDICATIONS:  This is a 75 year old gentleman who developed an extensive  diabetic foot infection involving the left fifth metatarsal.  He had had  a previous arteriogram which showed patent iliofemoral system and patent  fem-pop system with single-vessel runoff via the peroneal artery.  There  were no options for revascularization.  He presented with progressive  wound infection of the foot and was admitted for IV antibiotics and  presents now for amputation of the fourth and fifth toes.   TECHNIQUE:  The patient was taken to the operating room and received a  general anesthetic.  The left foot was prepped and draped in the usual  sterile fashion.  Upon exploration in the OR, there was a wound on the  lateral aspect of the fourth toe and once I had debrided this.  It was  clear that the fourth toe was clearly infected also.  The fifth toe had  extensive gangrene involving the metatarsal laterally.  I therefore made  a fishmouth incision encompassing the fourth and fifth toes.  On the  lateral aspect of the foot, dissection was carried down to the  metatarsal bone which was divided using the TPS saw and was beveled via  bone to allow for some skin closure over the bone at this level.  The  fourth metatarsal was also sharply divided.  The wound was  irrigated  with copious amounts of saline.  Intraoperative cultures were obtained.  Once all hemostasis was obtained, several interrupted 3-0 Vicryls were  placed for deep layer closure over the bone.  I did not want to create  any tension on the skin.  I placed 2 interrupted 3-0 nylons to close  over the lateral aspect of the foot proximally.  A VAC was then applied  and suction was applied to 125 cm of suction.  There was a good seal.  Hemostasis had been obtained.  The patient was transferred to the  recovery room in stable condition.  All needle and sponge counts were  correct.      Di Kindle. Edilia Bo, M.D.  Electronically Signed     CSD/MEDQ  D:  09/14/2008  T:  09/14/2008  Job:  [id number]

## 2011-02-22 NOTE — Op Note (Signed)
NAME:  Adrian Shannon, Adrian Shannon               ACCOUNT NO.:  [id number]   MEDICAL RECORD NO.:  [id number]          PATIENT TYPE:  AMB   LOCATION:  SDS                          FACILITY:  [facility]   PHYSICIAN:  Di Kindle. Edilia Bo, M.D.DATE OF BIRTH:  [birth year]   DATE OF PROCEDURE:  03/17/2006  DATE OF DISCHARGE:                                 OPERATIVE REPORT   PREOPERATIVE DIAGNOSIS:  Tibial occlusive disease with nonhealing wound of  the left great toe.   POSTOPERATIVE DIAGNOSIS:  Tibial occlusive disease with nonhealing wound of  the left great toe.   PROCEDURE:  1.  Aortogram.  2.  Bilateral iliac arteriogram.  3.  Bilateral lower extremity runoff.   SURGEON:  Waverly Ferrari, M.D.   ASSISTANT:  Nurse.   ANESTHESIA:  Local with sedation.   TECHNIQUE:  The patient was taken to the PV lab at [facility] and sedated with a  milligram of Versed and 50 mcg of fentanyl.  Both groins were prepped and  draped in the usual sterile fashion.  After the skin was infiltrated with 1%  lidocaine, the right common femoral artery was cannulated and a guidewire  introduced into the infrarenal aorta under fluoroscopic control.  A 5-French  sheath was introduced over the wire.  The dilator was removed.  A pigtail  catheter was positioned at the L1 vertebral body and flush aortogram  obtained.  The catheter was then repositioned above the aortic bifurcation  and an oblique iliac projection was obtained.  Bilateral lower extremity  runoff films were obtained.  The pigtail catheter was then removed over wire  and additional spot film was obtained of the right leg.   FINDINGS:  There is single renal artery on the right and two renal arteries  on the left with no significant renal artery stenosis noted.  The inferior  renal artery is the nondominant artery.  The infrarenal aorta is widely  patent without evidence of significant atherosclerotic disease.  Both common  iliac arteries, external iliac arteries,  and hypogastric arteries are widely  patent bilaterally.   On the right side the right common femoral artery, deep femoral artery,  superficial femoral artery and popliteal arteries are widely patent.  There  is some mild disease in the popliteal artery at the level of the knee.  There is severe tibial occlusive disease on the right.  There is a high take  off of the anterior tibial artery which is occluded proximally.  The  peroneal artery is patent with a short segment stenosis at the proximal at  the proximal third to middle third of the artery.  The posterior tibial  artery is occluded.  The peroneal artery is patent to the ankle on the  right.   On the left side the common femoral, superficial femoral and deep femoral  arteries are widely patent.  Popliteal artery is patent with mild disease at  the level of the knee.  The anterior tibial artery is occluded.  The  posterior tibial artery has severe disease proximally and is then occluded.  The peroneal artery is patent to the  ankle on the left.   CONCLUSIONS:  No significant aortoiliac occlusive disease.  No significant  femoral popliteal artery occlusive disease bilaterally, severe tibial  occlusive disease bilaterally as described above.      Di Kindle. Edilia Bo, M.D.  Electronically Signed     CSD/MEDQ  D:  03/17/2006  T:  03/17/2006  Job:  [id number]   cc:   Tinnie Gens A. Petrinitz, D.P.M.  Fax: [phone]

## 2011-02-22 NOTE — Op Note (Signed)
NAME:  Adrian Shannon, Adrian Shannon                           ACCOUNT NO.:  [id number]   MEDICAL RECORD NO.:  [id number]                   PATIENT TYPE:  AMB   LOCATION:  ENDO                                 FACILITY:  [facility]   PHYSICIAN:  John C. Madilyn Fireman, M.D.                 DATE OF BIRTH:  10-07-1936   DATE OF PROCEDURE:  03/27/2004  DATE OF DISCHARGE:                                 OPERATIVE REPORT   PROCEDURE:  Colonoscopy with polypectomy.   INDICATIONS FOR PROCEDURE:  Occasional rectal bleeding in a 75 year old  patient with no prior colon screening.   DESCRIPTION OF PROCEDURE:  The patient was placed in the left lateral  decubitus position and placed on the pulse monitor with continuous low-flow  oxygen delivered by nasal cannula.  He was sedated with 62.5 mcg IV fentanyl  and 7 mg IV Versed.  The Olympus video colonoscope was inserted into the  rectum and advanced to the cecum, confirmed by transillumination at  McBurney's point and visualization of the ileocecal valve and appendiceal  orifice.  The prep was excellent.  The cecum and ascending colon appeared  normal with no masses, polyps, diverticula, or other mucosal abnormalities.  Within the transverse colon, there was an 8 mm polyp that was removed by  snare.  The remainder of the transverse, descending, and sigmoid appeared  normal.  In the rectum at 15 cm, there was a 1.5 cm polyp that was removed  by snare.  The remainder of the rectum appeared normal.  The scope was then  withdrawn, and the patient returned to the recovery room in stable  condition.  He tolerated the procedure well, and there were no immediate  complications.   IMPRESSION:  Rectal and transverse colon polyps.   PLAN:  Await histology to rule out malignancy and to determine interval for  next colonoscopy.                                               John C. Madilyn Fireman, M.D.    JCH/MEDQ  D:  03/27/2004  T:  03/27/2004  Job:  [id number]   cc:   Cassell Clement,  M.D.  [address], [address]  [place]  [place] [address]  Fax: [phone]

## 2011-02-22 NOTE — Op Note (Signed)
NAME:  Adrian Shannon, Adrian Shannon               ACCOUNT NO.:  [id number]   MEDICAL RECORD NO.:  [id number]          PATIENT TYPE:  AMB   LOCATION:  SDS                          FACILITY:  [facility]   PHYSICIAN:  Di Kindle. Edilia Bo, M.D.DATE OF BIRTH:  09-Sep-1936   DATE OF PROCEDURE:  05/20/2006  DATE OF DISCHARGE:                                 OPERATIVE REPORT   PREOPERATIVE DIAGNOSIS:  Osteomyelitis of the left great toe.   POSTOPERATIVE DIAGNOSIS:  Osteomyelitis of the left great toe.   PROCEDURE:  Left great toe amputation.   SURGEON:  Di Kindle. Edilia Bo, M.D.   ANESTHESIA:  Regional.   TECHNIQUE:  The patient was taken to the operating room after a regional  block was placed.  A fishmouth incision was marked encompassing the left  great toe where the patient had documented osteomyelitis.  The dissection  was carried down through the skin, subcutaneous tissue, and fascia to the  bone which was dissected free circumferentially.  The bone was then divided  and then rongeured back to healthy bone.  Hemostasis was obtained using  electrocautery.  The wound was irrigated with copious amounts of saline.  The deep layer was closed with interrupted 3-0 Vicryl.  The skin was closed  with 3-0 nylons.  A sterile dressing was applied.  The patient tolerated the  procedure well was transferred to the recovery room in satisfactory  condition.  All needle and sponge counts were correct.      Di Kindle. Edilia Bo, M.D.  Electronically Signed     CSD/MEDQ  D:  05/20/2006  T:  05/20/2006  Job:  [id number]

## 2011-03-11 ENCOUNTER — Ambulatory Visit (INDEPENDENT_AMBULATORY_CARE_PROVIDER_SITE_OTHER): Payer: Medicare Other | Admitting: *Deleted

## 2011-03-11 DIAGNOSIS — I4891 Unspecified atrial fibrillation: Secondary | ICD-10-CM

## 2011-03-11 DIAGNOSIS — Z7901 Long term (current) use of anticoagulants: Secondary | ICD-10-CM

## 2011-03-22 ENCOUNTER — Telehealth: Payer: Self-pay | Admitting: Cardiology

## 2011-03-22 NOTE — Telephone Encounter (Signed)
Called in needing a refill of his diabetes test strips to [facility] call back: [phone], fax: [phone]. I have pulled the chart.

## 2011-03-22 NOTE — Telephone Encounter (Signed)
Faxed signed rx.

## 2011-03-26 ENCOUNTER — Other Ambulatory Visit: Payer: Self-pay | Admitting: *Deleted

## 2011-03-26 DIAGNOSIS — I10 Essential (primary) hypertension: Secondary | ICD-10-CM

## 2011-03-26 MED ORDER — FUROSEMIDE 40 MG PO TABS: 40.0000 mg | ORAL_TABLET | ORAL | Status: DC | PRN

## 2011-03-26 NOTE — Telephone Encounter (Signed)
Refilled meds per fax request.  

## 2011-04-08 ENCOUNTER — Ambulatory Visit (INDEPENDENT_AMBULATORY_CARE_PROVIDER_SITE_OTHER): Payer: Medicare Other | Admitting: *Deleted

## 2011-04-08 DIAGNOSIS — I4891 Unspecified atrial fibrillation: Secondary | ICD-10-CM

## 2011-04-08 DIAGNOSIS — Z7901 Long term (current) use of anticoagulants: Secondary | ICD-10-CM

## 2011-04-08 LAB — POCT INR: INR: 2

## 2011-04-15 ENCOUNTER — Other Ambulatory Visit: Payer: Self-pay | Admitting: Cardiology

## 2011-04-16 ENCOUNTER — Other Ambulatory Visit: Payer: Self-pay | Admitting: *Deleted

## 2011-04-16 ENCOUNTER — Telehealth: Payer: Self-pay | Admitting: Cardiology

## 2011-04-16 MED ORDER — LISINOPRIL 20 MG PO TABS: 20.0000 mg | ORAL_TABLET | Freq: Two times a day (BID) | ORAL | Status: DC

## 2011-04-16 MED ORDER — SIMVASTATIN 20 MG PO TABS: 20.0000 mg | ORAL_TABLET | Freq: Every day | ORAL | Status: AC

## 2011-04-16 MED ORDER — METFORMIN HCL 500 MG PO TABS: 500.0000 mg | ORAL_TABLET | Freq: Two times a day (BID) | ORAL | Status: AC

## 2011-04-16 NOTE — Telephone Encounter (Signed)
Fax received from pharmacy. Refill completed. Jodette Karimah Winquist RN  

## 2011-04-16 NOTE — Telephone Encounter (Signed)
pharamacy has wuestions concerning pt metformin that was called in.

## 2011-04-16 NOTE — Telephone Encounter (Signed)
Called pharmacy confirmed there was no change in script glucophage 500mg  two tab po bid

## 2011-04-26 ENCOUNTER — Other Ambulatory Visit: Payer: Self-pay | Admitting: Cardiology

## 2011-04-26 DIAGNOSIS — I119 Hypertensive heart disease without heart failure: Secondary | ICD-10-CM

## 2011-04-26 DIAGNOSIS — E119 Type 2 diabetes mellitus without complications: Secondary | ICD-10-CM

## 2011-04-26 NOTE — Telephone Encounter (Signed)
escribe request  

## 2011-05-10 ENCOUNTER — Ambulatory Visit (INDEPENDENT_AMBULATORY_CARE_PROVIDER_SITE_OTHER): Payer: Medicare Other | Admitting: Cardiology

## 2011-05-10 ENCOUNTER — Ambulatory Visit (INDEPENDENT_AMBULATORY_CARE_PROVIDER_SITE_OTHER): Payer: Medicare Other | Admitting: *Deleted

## 2011-05-10 ENCOUNTER — Encounter: Payer: Self-pay | Admitting: Cardiology

## 2011-05-10 DIAGNOSIS — I4891 Unspecified atrial fibrillation: Secondary | ICD-10-CM

## 2011-05-10 DIAGNOSIS — I350 Nonrheumatic aortic (valve) stenosis: Secondary | ICD-10-CM

## 2011-05-10 DIAGNOSIS — Z9889 Other specified postprocedural states: Secondary | ICD-10-CM

## 2011-05-10 DIAGNOSIS — I359 Nonrheumatic aortic valve disorder, unspecified: Secondary | ICD-10-CM

## 2011-05-10 DIAGNOSIS — Z951 Presence of aortocoronary bypass graft: Secondary | ICD-10-CM

## 2011-05-10 DIAGNOSIS — E119 Type 2 diabetes mellitus without complications: Secondary | ICD-10-CM

## 2011-05-10 DIAGNOSIS — Z7901 Long term (current) use of anticoagulants: Secondary | ICD-10-CM

## 2011-05-10 NOTE — Assessment & Plan Note (Signed)
The patient has known ischemic heart disease.  He had cardiac bypass graft surgery in 1996.  He has not been expressing any angina pectoris.  He gets exercise by doing yard work at home.

## 2011-05-10 NOTE — Assessment & Plan Note (Signed)
The patient has a history of chronic atrial fibrillation.  He has not had any TIA or stroke symptoms.  He's not having any symptoms of congestive heart failure.

## 2011-05-10 NOTE — Progress Notes (Signed)
Adrian Shannon Date of Birth:  [birth year] [facility] [address]   [address] [place], [place]  [address] [phone]           Fax   [phone]  History of Present Illness: This pleasant 75 year old gentleman is seen for a scheduled followup office visit.  He has been doing well.  He has a past history of diabetes mellitus, essential hypertension, dyslipidemia, chronic atrial fibrillation, and peripheral arterial occlusive disease.  He also has a history of ischemic heart disease and had coronary artery bypass graft surgery in 1996.  His last nuclear stress test was 02/18/08 and showed an old inferior wall MI but no reversible ischemia and his ejection fraction was 51%.  His last echocardiogram was 06/18/10 and showed moderate LVH with normal systolic function, moderate aortic stenosis, mild mitral valve sclerosis with mild mitral regurgitation and his pulmonary artery pressure was 37.  Current Outpatient Prescriptions  Medication Sig Dispense Refill  . allopurinol (ZYLOPRIM) 100 MG tablet TAKE ONE TABLET BY MOUTH ONE TIME DAILY  30 tablet  PRN  . amLODipine (NORVASC) 5 MG tablet TAKE ONE TABLET BY MOUTH ONE TIME DAILY  30 tablet  PRN  . aspirin 81 MG tablet Take 81 mg by mouth daily.        Marland Kitchen BAYER CONTOUR TEST test strip as directed.      . ferrous sulfate 325 (65 FE) MG tablet Take 325 mg by mouth daily with breakfast.        . furosemide (LASIX) 40 MG tablet Take 1 tablet (40 mg total) by mouth as needed.  30 tablet  11  . glimepiride (AMARYL) 4 MG tablet TAKE TWO TABLETS BY MOUTH DAILY  60 tablet  PRN  . lisinopril (PRINIVIL,ZESTRIL) 20 MG tablet Take 1 tablet (20 mg total) by mouth 2 (two) times daily.  60 tablet  5  . metFORMIN (GLUCOPHAGE) 500 MG tablet Take 1 tablet (500 mg total) by mouth 2 (two) times daily with a meal. Taking 2 bid  120 tablet  5  . nitroGLYCERIN (NITROSTAT) 0.4 MG SL tablet Place 0.4 mg under the tongue every 5 (five) minutes as  needed.        . simvastatin (ZOCOR) 20 MG tablet Take 1 tablet (20 mg total) by mouth at bedtime.  30 tablet  5    No Known Allergies  Patient Active Problem List  Diagnoses  . DM  . HYPERLIPIDEMIA  . HYPERTENSION  . MYOCARDIAL INFARCTION  . Atrial fibrillation  . PERIPHERAL VASCULAR DISEASE  . DYSPNEA  . Long term current use of anticoagulant  . Hx of CABG    History  Smoking status  . Unknown If Ever Smoked  Smokeless tobacco  . Current User  . Types: Chew    History  Alcohol Use: Not on file    Family History  Problem Relation Age of Onset  . Diabetes Mother   . Coronary artery disease Mother   . Heart attack Brother     Review of Systems: Constitutional: no fever chills diaphoresis or fatigue or change in weight.  Head and neck: no hearing loss, no epistaxis, no photophobia or visual disturbance. Respiratory: No cough, shortness of breath or wheezing. Cardiovascular: No chest pain peripheral edema, palpitations. Gastrointestinal: No abdominal distention, no abdominal pain, no change in bowel habits hematochezia or melena. Genitourinary: No dysuria, no frequency, no urgency, no nocturia. Musculoskeletal:No arthralgias, no back pain, no gait disturbance or myalgias. Neurological:  No dizziness, no headaches, no numbness, no seizures, no syncope, no weakness, no tremors. Hematologic: No lymphadenopathy, no easy bruising. Psychiatric: No confusion, no hallucinations, no sleep disturbance.    Physical Exam: Filed Vitals:   05/10/11 0842  BP: 110/70  Pulse: 78  the general appearance reveals a well-developed well-nourished Caucasian male in no distress.Pupils equal and reactive.   Extraocular Movements are full.  There is no scleral icterus.  The mouth and pharynx are normal.  The neck is supple.  The carotids reveal soft bilateral carotid bruits. The jugular venous pressure is normal.  The thyroid is not enlarged.  There is no lymphadenopathy.The chest is clear  to percussion and auscultation. There are no rales or rhonchi. Expansion of the chest is symmetrical.  The precordium is quiet.  The first heart sound is normal.  The second heart sound is physiologically split.  There is no gallop rub or click.  There is no abnormal lift or heave.there is a soft ejection murmur at the base consistent with aortic stenosis.  The chest is clear without rales or rhonchi .  The abdomen is soft and nontender without hepatomegaly or masses.  Extremities reveal 1+ pedal pulses and no peripheral edema.  Neurologic exam is physiologic.The skin is warm and dry.  There is no rash.           Assessment / Plan: Continue present medication.  His INR is 1.9 and he'll continue same Coumadin and cut back slightly on green vegetables.  Recheck in 3 months for followup office visit and fasting lab work

## 2011-05-10 NOTE — Assessment & Plan Note (Signed)
Patient has diabetes mellitus.  He has not been experiencing any hypoglycemic episodes.  His blood sugars at home in the morning are usually in the range of the 110 he does have a history of peripheral neuropathy which has been stable.

## 2011-05-11 ENCOUNTER — Other Ambulatory Visit: Payer: Self-pay | Admitting: Cardiology

## 2011-05-13 ENCOUNTER — Other Ambulatory Visit: Payer: Self-pay | Admitting: *Deleted

## 2011-05-13 DIAGNOSIS — I4891 Unspecified atrial fibrillation: Secondary | ICD-10-CM

## 2011-05-13 MED ORDER — WARFARIN SODIUM 5 MG PO TABS: 5.0000 mg | ORAL_TABLET | Freq: Every day | ORAL | Status: AC

## 2011-05-13 NOTE — Telephone Encounter (Signed)
Patient no longer on jantoven

## 2011-05-13 NOTE — Telephone Encounter (Signed)
Refilled jantoven 5 mg. To [facility]

## 2011-06-07 ENCOUNTER — Ambulatory Visit (INDEPENDENT_AMBULATORY_CARE_PROVIDER_SITE_OTHER): Payer: Medicare Other | Admitting: *Deleted

## 2011-06-07 DIAGNOSIS — Z7901 Long term (current) use of anticoagulants: Secondary | ICD-10-CM

## 2011-06-07 DIAGNOSIS — I4891 Unspecified atrial fibrillation: Secondary | ICD-10-CM

## 2011-06-12 ENCOUNTER — Other Ambulatory Visit: Payer: Self-pay | Admitting: Cardiology

## 2011-06-12 ENCOUNTER — Telehealth: Payer: Self-pay | Admitting: Cardiology

## 2011-06-12 DIAGNOSIS — I10 Essential (primary) hypertension: Secondary | ICD-10-CM

## 2011-06-12 DIAGNOSIS — E119 Type 2 diabetes mellitus without complications: Secondary | ICD-10-CM

## 2011-06-12 DIAGNOSIS — E785 Hyperlipidemia, unspecified: Secondary | ICD-10-CM

## 2011-06-12 DIAGNOSIS — I219 Acute myocardial infarction, unspecified: Secondary | ICD-10-CM

## 2011-06-12 NOTE — Telephone Encounter (Signed)
[facility] requesting last OV Note and Labs available.  Please fax today.

## 2011-06-13 NOTE — Telephone Encounter (Signed)
Faxed over last OV and Labs today.

## 2011-06-28 LAB — COMPREHENSIVE METABOLIC PANEL
BUN: 7
CO2: 28
Chloride: 104
Creatinine, Ser: 1.16
GFR calc non Af Amer: >60
Glucose, Bld: 169 — ABNORMAL HIGH
Total Bilirubin: 1.1

## 2011-06-28 LAB — APTT: aPTT: 35

## 2011-06-28 LAB — CBC
HCT: 33.3 — ABNORMAL LOW
Hemoglobin: 11 — ABNORMAL LOW
MCV: 90.9
Platelets: 273
RBC: 3.66 — ABNORMAL LOW
WBC: 8

## 2011-06-28 LAB — PROTIME-INR
INR: 1.7 — ABNORMAL HIGH
Prothrombin Time: 20 — ABNORMAL HIGH

## 2011-07-01 LAB — POCT I-STAT 4, (NA,K, GLUC, HGB,HCT)
Glucose, Bld: 176 — ABNORMAL HIGH
HCT: 43

## 2011-07-01 LAB — PROTIME-INR
INR: 1.1
INR: 1.2
Prothrombin Time: 14
Prothrombin Time: 15

## 2011-07-01 LAB — WOUND CULTURE

## 2011-07-05 ENCOUNTER — Encounter: Payer: Medicare Other | Admitting: *Deleted

## 2011-07-12 LAB — PROTIME-INR
INR: 2.5 — ABNORMAL HIGH (ref 0.00–1.49)
INR: 2.9 — ABNORMAL HIGH (ref 0.00–1.49)
Prothrombin Time: 23.7 seconds — ABNORMAL HIGH (ref 11.6–15.2)
Prothrombin Time: 32.2 seconds — ABNORMAL HIGH (ref 11.6–15.2)

## 2011-07-12 LAB — BASIC METABOLIC PANEL
BUN: 11 mg/dL (ref 6–23)
BUN: 6 mg/dL (ref 6–23)
BUN: 7 mg/dL (ref 6–23)
CO2: 32 mEq/L (ref 19–32)
Calcium: 8.1 mg/dL — ABNORMAL LOW (ref 8.4–10.5)
Calcium: 8.6 mg/dL (ref 8.4–10.5)
Creatinine, Ser: 0.69 mg/dL (ref 0.4–1.5)
GFR calc Af Amer: >60 mL/min (ref >60)
GFR calc non Af Amer: >60 mL/min (ref >60)
GFR calc non Af Amer: >60 mL/min (ref >60)
Glucose, Bld: 121 mg/dL — ABNORMAL HIGH (ref 70–99)
Glucose, Bld: 62 mg/dL — ABNORMAL LOW (ref 70–99)
Potassium: 3.8 mEq/L (ref 3.5–5.1)
Sodium: 138 mEq/L (ref 135–145)
Sodium: 140 mEq/L (ref 135–145)

## 2011-07-12 LAB — HEMOGLOBIN A1C: Mean Plasma Glucose: 166 mg/dL

## 2011-07-12 LAB — GLUCOSE, CAPILLARY
Glucose-Capillary: 112 mg/dL — ABNORMAL HIGH (ref 70–99)
Glucose-Capillary: 119 mg/dL — ABNORMAL HIGH (ref 70–99)
Glucose-Capillary: 135 mg/dL — ABNORMAL HIGH (ref 70–99)
Glucose-Capillary: 141 mg/dL — ABNORMAL HIGH (ref 70–99)
Glucose-Capillary: 194 mg/dL — ABNORMAL HIGH (ref 70–99)
Glucose-Capillary: 210 mg/dL — ABNORMAL HIGH (ref 70–99)
Glucose-Capillary: 238 mg/dL — ABNORMAL HIGH (ref 70–99)
Glucose-Capillary: 37 mg/dL — CL (ref 70–99)
Glucose-Capillary: 46 mg/dL — ABNORMAL LOW (ref 70–99)
Glucose-Capillary: 81 mg/dL (ref 70–99)
Glucose-Capillary: 84 mg/dL (ref 70–99)

## 2011-07-12 LAB — CBC
HCT: 25.9 % — ABNORMAL LOW (ref 39.0–52.0)
Hemoglobin: 10.4 g/dL — ABNORMAL LOW (ref 13.0–17.0)
Hemoglobin: 8.6 g/dL — ABNORMAL LOW (ref 13.0–17.0)
Hemoglobin: 9.1 g/dL — ABNORMAL LOW (ref 13.0–17.0)
MCHC: 32.9 g/dL (ref 30.0–36.0)
Platelets: 250 10*3/uL (ref 150–400)
Platelets: 274 10*3/uL (ref 150–400)
Platelets: 288 10*3/uL (ref 150–400)
RBC: 3.02 MIL/uL — ABNORMAL LOW (ref 4.22–5.81)
RDW: 14.4 % (ref 11.5–15.5)
RDW: 15.1 % (ref 11.5–15.5)
WBC: 9.5 10*3/uL (ref 4.0–10.5)

## 2011-07-12 LAB — WOUND CULTURE: Gram Stain: NONE SEEN

## 2011-07-12 LAB — CULTURE, BLOOD (ROUTINE X 2): Culture: NO GROWTH

## 2011-07-12 LAB — VANCOMYCIN, TROUGH: Vancomycin Tr: 16.3 ug/mL (ref 10.0–20.0)

## 2011-07-12 LAB — ANAEROBIC CULTURE

## 2011-08-12 ENCOUNTER — Ambulatory Visit: Payer: Medicare Other | Admitting: Cardiology

## 2011-08-12 ENCOUNTER — Other Ambulatory Visit: Payer: Medicare Other | Admitting: *Deleted

## 2011-08-20 ENCOUNTER — Telehealth: Payer: Self-pay | Admitting: Cardiology

## 2011-08-20 NOTE — Telephone Encounter (Signed)
New problem:  Per Jimmy calling checking on status of form that was faxed over on 11/5.

## 2011-08-20 NOTE — Telephone Encounter (Signed)
Wendie Agreste forms received and waiting for  Dr. Patty Sermons to complete

## 2011-08-27 ENCOUNTER — Telehealth: Payer: Self-pay | Admitting: Cardiology

## 2011-08-27 NOTE — Telephone Encounter (Addendum)
Fax received from [facility] requesting Records, LOV ( out of paper chart) faxed to [phone]  08/27/11/km  Note Generated From [employer] faxed to [facility] @ [phone]  09/13/11/KM

## 2011-09-03 ENCOUNTER — Other Ambulatory Visit: Payer: Medicare Other | Admitting: *Deleted

## 2011-09-03 ENCOUNTER — Ambulatory Visit: Payer: Medicare Other | Admitting: Cardiology

## 2011-09-04 ENCOUNTER — Encounter: Payer: Self-pay | Admitting: Cardiology

## 2011-10-14 ENCOUNTER — Other Ambulatory Visit: Payer: Self-pay | Admitting: *Deleted

## 2011-10-14 MED ORDER — LISINOPRIL 20 MG PO TABS: 20.0000 mg | ORAL_TABLET | Freq: Two times a day (BID) | ORAL | Status: DC

## 2011-10-14 NOTE — Telephone Encounter (Signed)
Refilled lisinopril

## 2013-12-17 ENCOUNTER — Ambulatory Visit: Payer: Self-pay | Admitting: Cardiology

## 2013-12-17 DIAGNOSIS — Z7901 Long term (current) use of anticoagulants: Secondary | ICD-10-CM

## 2013-12-17 DIAGNOSIS — I4891 Unspecified atrial fibrillation: Secondary | ICD-10-CM

## 2014-05-05 LAB — PROTIME-INR

## 2014-07-04 LAB — PROTIME-INR

## 2014-08-01 LAB — PROTIME-INR

## 2014-09-05 ENCOUNTER — Telehealth: Payer: Self-pay | Admitting: Cardiology

## 2014-09-05 NOTE — Telephone Encounter (Signed)
Spoke with patient and scheduled ov for next week.  Per patient his PCP requested him be seen for lower extremity swelling, last seen 2012. Requested records from PCP

## 2014-09-05 NOTE — Telephone Encounter (Signed)
New message ° ° ° ° ° °Talk to the nurse----pt would not tell me what he wanted °

## 2014-09-09 ENCOUNTER — Encounter: Payer: Self-pay | Admitting: *Deleted

## 2014-09-13 ENCOUNTER — Ambulatory Visit (INDEPENDENT_AMBULATORY_CARE_PROVIDER_SITE_OTHER): Payer: Medicare Other | Admitting: Cardiology

## 2014-09-13 ENCOUNTER — Encounter: Payer: Self-pay | Admitting: Cardiology

## 2014-09-13 VITALS — BP 130/70 | HR 67 | Ht 72.0 in | Wt 200.0 lb

## 2014-09-13 DIAGNOSIS — I482 Chronic atrial fibrillation, unspecified: Secondary | ICD-10-CM

## 2014-09-13 DIAGNOSIS — R0602 Shortness of breath: Secondary | ICD-10-CM

## 2014-09-13 DIAGNOSIS — R609 Edema, unspecified: Secondary | ICD-10-CM

## 2014-09-13 DIAGNOSIS — I359 Nonrheumatic aortic valve disorder, unspecified: Secondary | ICD-10-CM

## 2014-09-13 DIAGNOSIS — I350 Nonrheumatic aortic (valve) stenosis: Secondary | ICD-10-CM

## 2014-09-13 NOTE — Progress Notes (Signed)
Maralyn SagoJames R Yepiz Date of Birth:  1936-06-20 [facility] [address] [place], [place]  [id number] [phone]        Fax   [phone]   History of Present Illness: This pleasant 78 year old gentleman is seen at the request of Dr. Joycelyn RuaStephen Meyers.  We had not seen Mr. Duffy RhodyStanley for many years.  He has a history of chronic atrial fibrillation and is on long-term Coumadin.  He has remote history of ischemic heart disease and had coronary artery bypass graft surgery in 1996.  He has a history of peripheral vascular disease.  He has a known heart murmur and at his last echocardiogram was noted to have moderate aortic stenosis.  The patient is diabetic and has had recent problems with hypoglycemia.  He had an episode of hypoglycemia the day before Thanksgiving at which time his blood sugar dropped to 38.  His son who is a Company secretaryfireman and lives nearby is able to help with some of Mr. Lafonda MossesStanley's medicines.  Recently the patient has become concerned because of increased swelling of his legs and his feet.  He is not certain as to whether he is taking furosemide or not.  The patient had signs and symptoms of a recent urinary tract infection which responded to antibiotics.  The patient has not been having any chest pain but has been more short of breath.  Current Outpatient Prescriptions  Medication Sig Dispense Refill  . allopurinol (ZYLOPRIM) 100 MG tablet TAKE ONE TABLET BY MOUTH ONE TIME DAILY 30 tablet PRN  . amLODipine (NORVASC) 5 MG tablet TAKE ONE TABLET BY MOUTH ONE TIME DAILY 30 tablet PRN  . aspirin 81 MG tablet Take 81 mg by mouth daily.      Marland Kitchen. BAYER CONTOUR TEST test strip as directed.    . ferrous sulfate 325 (65 FE) MG tablet Take 325 mg by mouth daily with breakfast.      . furosemide (LASIX) 40 MG tablet Take 1 tablet (40 mg total) by mouth as needed. 30 tablet 11  . glimepiride (AMARYL) 4 MG tablet TAKE TWO TABLETS BY MOUTH DAILY 60 tablet PRN  . lisinopril  (PRINIVIL,ZESTRIL) 40 MG tablet Take 40 mg by mouth daily.    . metFORMIN (GLUCOPHAGE) 500 MG tablet Take 1 tablet (500 mg total) by mouth 2 (two) times daily with a meal. Taking 2 bid 120 tablet 5  . nitroGLYCERIN (NITROSTAT) 0.4 MG SL tablet Place 0.4 mg under the tongue every 5 (five) minutes as needed.      . pioglitazone (ACTOS) 15 MG tablet Take 15 mg by mouth daily.    . potassium chloride SA (K-DUR,KLOR-CON) 20 MEQ tablet Take 20 mEq by mouth daily.    . simvastatin (ZOCOR) 20 MG tablet Take 1 tablet (20 mg total) by mouth at bedtime. 30 tablet 5  . warfarin (JANTOVEN) 5 MG tablet Take 1 tablet (5 mg total) by mouth daily. 50 tablet 11   No current facility-administered medications for this visit.    Allergies  Allergen Reactions  . Januvia [Sitagliptin] Other (See Comments)    Dizzy, sob    Patient Active Problem List   Diagnosis Date Noted  . DM 06/10/2008    Priority: High  . Atrial fibrillation 06/10/2008    Priority: High  . HYPERTENSION 06/10/2008    Priority: Medium  . PERIPHERAL VASCULAR DISEASE 06/10/2008    Priority: Medium  . Aortic stenosis, moderate 05/10/2011  . Hx of CABG 02/07/2011  .  Long term current use of anticoagulant 01/14/2011  . HYPERLIPIDEMIA 06/10/2008  . MYOCARDIAL INFARCTION 06/10/2008  . DYSPNEA 06/10/2008    History  Smoking status  . Never Smoker   Smokeless tobacco  . Current User  . Types: Chew    History  Alcohol Use: Not on file    Family History  Problem Relation Age of Onset  . Diabetes Mother   . Coronary artery disease Mother   . Heart attack Brother     Review of Systems: Constitutional: no fever chills diaphoresis or fatigue or change in weight.  Head and neck: no hearing loss, no epistaxis, no photophobia or visual disturbance. Respiratory: No cough, shortness of breath or wheezing. Cardiovascular: No chest pain , palpitations.  Positive for bilateral peripheral edema. Gastrointestinal: No abdominal  distention, no abdominal pain, no change in bowel habits hematochezia or melena. Genitourinary: No dysuria, no frequency, no urgency, no nocturia. Musculoskeletal:No arthralgias, no back pain, no gait disturbance or myalgias. Neurological: No dizziness, no headaches, no numbness, no seizures, no syncope, no weakness, no tremors. Hematologic: No lymphadenopathy, no easy bruising. Psychiatric: No confusion, no hallucinations, no sleep disturbance.   Wt Readings from Last 3 Encounters:  09/13/14 200 lb (90.719 kg)  05/10/11 185 lb (83.915 kg)  02/07/11 184 lb (83.462 kg)    Physical Exam: Filed Vitals:   09/13/14 1548  BP: 130/70  Pulse: 67  The patient appears to be in no distress.  Head and neck exam reveals that the pupils are equal and reactive.  The extraocular movements are full.  There is no scleral icterus.  Mouth and pharynx are benign.  No lymphadenopathy.  No carotid bruits.  The jugular venous pressure is normal.  Thyroid is not enlarged or tender.  Chest is clear to percussion and auscultation.  No rales or rhonchi.  Expansion of the chest is symmetrical.  Heart reveals no abnormal lift or heave.  First and second heart sounds are normal.  The pulse is irregularly irregular.  There is a grade 3/6 harsh systolic ejection murmur at the base.  The abdomen is soft and nontender.  Bowel sounds are normoactive.  There is no hepatosplenomegaly or mass.  There are no abdominal bruits.  Extremities reveal 3+ soft pitting edema bilaterally in the lower legs and feet.    Neurologic exam is normal strength and no lateralizing weakness.  No sensory deficits.  Integument reveals no rash  EKG today shows atrial fibrillation with controlled ventricular response and nonspecific ST-T wave changes.  Possible old inferior wall myocardial infarction.  Since last tracing of 02/11/08, no significant change.  Assessment / Plan: 1.  Chronic atrial fibrillation on warfarin 2.  Aortic stenosis,  at least moderate. 3.  Ischemic heart disease status post prior CABG in 1996 by Dr. Tyrone SageGerhardt. 4.  Significant volume overload with bilateral peripheral edema.  Some of this may be related to his amlodipine. 5. Diabetes mellitus.  Plan: We will  Plan to check baseline labs with a CBC, basal metabolic panel, hepatic function panel, and B natruretic peptide.  We will arrange for a chest x-ray and a two-dimensional echocardiogram.  When we called him the results of his lab work we will find out whether he has indeed been taking his furosemide as instructed.  We will plan to see him back for a follow-up office visit in about one month.

## 2014-09-13 NOTE — Patient Instructions (Signed)
Will obtain labs today and call you with the results (CBC/BMET/HFP/BNP)  Your physician has requested that you have an echocardiogram. Echocardiography is a painless test that uses sound waves to create images of your heart. It provides your doctor with information about the size and shape of your heart and how well your heart's chambers and valves are working. This procedure takes approximately one hour. There are no restrictions for this procedure.  A chest x-ray takes a picture of the organs and structures inside the chest, including the heart, lungs, and blood vessels. This test can show several things, including, whether the heart is enlarges; whether fluid is building up in the lungs; and whether pacemaker / defibrillator leads are still in place. [facility]  Your physician recommends that you schedule a follow-up appointment in: 1 MONTH OV

## 2014-09-14 LAB — CBC WITH DIFFERENTIAL/PLATELET
BASOS PCT: 0.2 % (ref 0.0–3.0)
Basophils Absolute: 0 10*3/uL (ref 0.0–0.1)
EOS ABS: 0.1 10*3/uL (ref 0.0–0.7)
Eosinophils Relative: 1.1 % (ref 0.0–5.0)
HCT: 35.1 % — ABNORMAL LOW (ref 39.0–52.0)
HEMOGLOBIN: 11.4 g/dL — AB (ref 13.0–17.0)
LYMPHS PCT: 24.7 % (ref 12.0–46.0)
Lymphs Abs: 2.1 10*3/uL (ref 0.7–4.0)
MCHC: 32.5 g/dL (ref 30.0–36.0)
MCV: 99.2 fl (ref 78.0–100.0)
MONOS PCT: 5.7 % (ref 3.0–12.0)
Monocytes Absolute: 0.5 10*3/uL (ref 0.1–1.0)
NEUTROS ABS: 5.8 10*3/uL (ref 1.4–7.7)
NEUTROS PCT: 68.3 % (ref 43.0–77.0)
Platelets: 192 10*3/uL (ref 150.0–400.0)
RBC: 3.54 Mil/uL — AB (ref 4.22–5.81)
RDW: 15.4 % (ref 11.5–15.5)
WBC: 8.5 10*3/uL (ref 4.0–10.5)

## 2014-09-14 LAB — HEPATIC FUNCTION PANEL
ALT: 18 U/L (ref 0–53)
AST: 10 U/L (ref 0–37)
Albumin: 4.3 g/dL (ref 3.5–5.2)
Alkaline Phosphatase: 81 U/L (ref 39–117)
BILIRUBIN DIRECT: 0.2 mg/dL (ref 0.0–0.3)
BILIRUBIN TOTAL: 1.2 mg/dL (ref 0.2–1.2)
TOTAL PROTEIN: 7.2 g/dL (ref 6.0–8.3)

## 2014-09-14 LAB — BASIC METABOLIC PANEL
BUN: 25 mg/dL — AB (ref 6–23)
CHLORIDE: 99 meq/L (ref 96–112)
CO2: 26 meq/L (ref 19–32)
Calcium: 8.5 mg/dL (ref 8.4–10.5)
Creatinine, Ser: 1.3 mg/dL (ref 0.4–1.5)
GFR: 55.94 mL/min — ABNORMAL LOW (ref >60.00)
Glucose, Bld: 165 mg/dL — ABNORMAL HIGH (ref 70–99)
POTASSIUM: 4.1 meq/L (ref 3.5–5.1)
Sodium: 136 mEq/L (ref 135–145)

## 2014-09-14 LAB — BRAIN NATRIURETIC PEPTIDE: PRO B NATRI PEPTIDE: 223 pg/mL — AB (ref 0.0–100.0)

## 2014-09-16 ENCOUNTER — Ambulatory Visit (HOSPITAL_COMMUNITY): Payer: Medicare Other | Attending: Cardiovascular Disease | Admitting: Radiology

## 2014-09-16 ENCOUNTER — Ambulatory Visit
Admission: RE | Admit: 2014-09-16 | Discharge: 2014-09-16 | Disposition: A | Discharge status: Home / Self Care | Payer: Medicare Other | Source: Ambulatory Visit | Attending: Cardiology | Admitting: Cardiology

## 2014-09-16 ENCOUNTER — Telehealth: Payer: Self-pay | Admitting: *Deleted

## 2014-09-16 DIAGNOSIS — I350 Nonrheumatic aortic (valve) stenosis: Secondary | ICD-10-CM | POA: Diagnosis present

## 2014-09-16 DIAGNOSIS — R0602 Shortness of breath: Secondary | ICD-10-CM

## 2014-09-16 MED ORDER — FUROSEMIDE 40 MG PO TABS: 40.0000 mg | ORAL_TABLET | Freq: Two times a day (BID) | ORAL | Status: AC

## 2014-09-16 NOTE — Progress Notes (Signed)
Echocardiogram performed.  

## 2014-09-16 NOTE — Telephone Encounter (Signed)
Advised patient of lab results and medication change  

## 2014-09-16 NOTE — Telephone Encounter (Signed)
-----   Message from Cassell Clementhomas Brackbill, MD sent at 09/14/2014  5:30 PM EST ----- Please report.  There is mild anemia but improved since 2009.  The heart failure blood test is elevated.  I want him to increase his furosemide from 40 mg daily up to 80 mg daily.  I would like him to avoid alcohol.

## 2014-09-19 ENCOUNTER — Telehealth: Payer: Self-pay | Admitting: Cardiology

## 2014-09-19 MED ORDER — AMOXICILLIN-POT CLAVULANATE 500-125 MG PO TABS: 1.0000 | ORAL_TABLET | Freq: Two times a day (BID) | ORAL | Status: AC

## 2014-09-19 NOTE — Telephone Encounter (Signed)
-----   Message from Cassell Clementhomas Brackbill, MD sent at 09/18/2014  9:52 PM EST ----- The chest xray shows enlarged heart and increase fluid in lungs.  The increase in lasix dose should help this. They could not exclude a small patch of pneumonia.  He should let us know if he starts to have fever or purulent sputum etc.

## 2014-09-19 NOTE — Telephone Encounter (Signed)
Advised patient of echo and chest Xray Patient did start with cough over the weekend, coughed all day yesterday  Discussed with  Dr. Patty SermonsBrackbill and will Rx Augmentin 500/125 mg twice a day for 7 days Start Mucinex 600 mg twice a day as needed  Patient aware

## 2014-09-19 NOTE — Telephone Encounter (Signed)
-----   Message from Cassell Clementhomas Brackbill, MD sent at 09/18/2014  9:48 PM EST ----- Please report.  The echo shows good LV systolic function. The aortic valve stenosis is mild.  CSD

## 2014-09-19 NOTE — Telephone Encounter (Signed)
New Msg   Pt calling, wants to know if Dr. Patty SermonsBrackbill has called in cold medication.   Please call [phone].

## 2014-10-03 ENCOUNTER — Telehealth: Payer: Self-pay | Admitting: Cardiology

## 2014-10-03 NOTE — Telephone Encounter (Signed)
[employer] aware D/C has been signed by Dr.Brackbill and ready For pick up.

## 2014-10-03 NOTE — Telephone Encounter (Signed)
Original D/C received from [employer] gave to Melinda/Dr.Brackbill for Signature.

## 2014-10-07 DEATH — deceased
